# Patient Record
Sex: Male | Born: 1970 | Race: White | Hispanic: No | Marital: Single | Smoking: Never smoker
Health system: Southern US, Community
[De-identification: ages and names within clinical notes are randomized; demographics above are authoritative.]

## PROBLEM LIST (undated history)

## (undated) DIAGNOSIS — G809 Cerebral palsy, unspecified: Secondary | ICD-10-CM

## (undated) DIAGNOSIS — I10 Essential (primary) hypertension: Secondary | ICD-10-CM

## (undated) DIAGNOSIS — E7800 Pure hypercholesterolemia, unspecified: Secondary | ICD-10-CM

## (undated) DIAGNOSIS — R519 Headache, unspecified: Secondary | ICD-10-CM

## (undated) HISTORY — PX: OTHER SURGICAL HISTORY: SHX169

## (undated) HISTORY — PX: LEG SURGERY: SHX1003

---

## 1997-10-09 ENCOUNTER — Other Ambulatory Visit: Admission: RE | Admit: 1997-10-09 | Discharge: 1997-10-09 | Payer: Self-pay | Admitting: Family Medicine

## 1999-03-06 ENCOUNTER — Emergency Department (HOSPITAL_COMMUNITY): Admission: EM | Admit: 1999-03-06 | Discharge: 1999-03-06 | Payer: Self-pay

## 2004-04-30 ENCOUNTER — Emergency Department (HOSPITAL_COMMUNITY): Admission: EM | Admit: 2004-04-30 | Discharge: 2004-04-30 | Payer: Self-pay | Admitting: Emergency Medicine

## 2004-12-23 ENCOUNTER — Emergency Department (HOSPITAL_COMMUNITY): Admission: EM | Admit: 2004-12-23 | Discharge: 2004-12-23 | Payer: Self-pay | Admitting: Emergency Medicine

## 2007-04-27 ENCOUNTER — Emergency Department (HOSPITAL_COMMUNITY): Admission: EM | Admit: 2007-04-27 | Discharge: 2007-04-27 | Payer: Self-pay | Admitting: Emergency Medicine

## 2012-08-06 ENCOUNTER — Other Ambulatory Visit: Payer: Self-pay | Admitting: Family Medicine

## 2012-08-06 ENCOUNTER — Ambulatory Visit
Admission: RE | Admit: 2012-08-06 | Discharge: 2012-08-06 | Disposition: A | Discharge status: Home / Self Care | Payer: Medicaid Other | Source: Ambulatory Visit | Attending: Family Medicine | Admitting: Family Medicine

## 2012-08-06 DIAGNOSIS — R1030 Lower abdominal pain, unspecified: Secondary | ICD-10-CM

## 2014-05-09 DIAGNOSIS — K029 Dental caries, unspecified: Secondary | ICD-10-CM | POA: Insufficient documentation

## 2015-09-07 ENCOUNTER — Emergency Department (INDEPENDENT_AMBULATORY_CARE_PROVIDER_SITE_OTHER)
Admission: EM | Admit: 2015-09-07 | Discharge: 2015-09-07 | Disposition: A | Discharge status: Home / Self Care | Payer: Medicare Other | Source: Home / Self Care | Attending: Family Medicine | Admitting: Family Medicine

## 2015-09-07 ENCOUNTER — Ambulatory Visit: Payer: Medicaid Other

## 2015-09-07 ENCOUNTER — Encounter (HOSPITAL_COMMUNITY): Payer: Self-pay | Admitting: *Deleted

## 2015-09-07 DIAGNOSIS — L259 Unspecified contact dermatitis, unspecified cause: Secondary | ICD-10-CM | POA: Diagnosis not present

## 2015-09-07 HISTORY — DX: Essential (primary) hypertension: I10

## 2015-09-07 MED ORDER — FLUTICASONE PROPIONATE 0.05 % EX CREA: TOPICAL_CREAM | Freq: Two times a day (BID) | CUTANEOUS | Status: DC

## 2015-09-07 NOTE — ED Notes (Signed)
Pt  Has  A rash  On  r  Arm  Noticed  No  Known  Causative  Agent        Noticed  Today  Vesicular    In  Appearance       Itches

## 2015-09-07 NOTE — ED Provider Notes (Signed)
CSN: [id number]     Arrival date & time 09/07/15  1625 History   First MD Initiated Contact with Patient 09/07/15 1907     Chief Complaint  Patient presents with  . Rash   (Consider location/radiation/quality/duration/timing/severity/associated sxs/prior Treatment) Patient is a 45 y.o. male presenting with rash. The history is provided by the patient.  Rash Location:  Shoulder/arm Shoulder/arm rash location:  L forearm and R forearm Quality: blistering, itchiness, peeling, redness, scaling and swelling   Severity:  Mild Onset quality:  Sudden Duration:  1 day Progression:  Spreading Chronicity:  New   Past Medical History  Diagnosis Date  . Hypertension    Past Surgical History  Procedure Laterality Date  . Leg surgery     History reviewed. No pertinent family history. Social History  Substance Use Topics  . Smoking status: Never Smoker   . Smokeless tobacco: None  . Alcohol Use: No    Review of Systems  Constitutional: Negative.   Musculoskeletal: Negative.   Skin: Positive for rash.  All other systems reviewed and are negative.   Allergies  Review of patient's allergies indicates not on file.  Home Medications   Prior to Admission medications   Medication Sig Start Date End Date Taking? Authorizing Provider  atenolol (TENORMIN) 100 MG tablet Take 100 mg by mouth daily.   Yes Historical Provider, MD  Diphenoxylate-Atropine (LOMOTIL PO) Take by mouth.   Yes Historical Provider, MD  lisinopril-hydrochlorothiazide (PRINZIDE,ZESTORETIC) 20-25 MG tablet Take 1 tablet by mouth daily.   Yes Historical Provider, MD  simvastatin (ZOCOR) 20 MG tablet Take 20 mg by mouth daily.   Yes Historical Provider, MD  fluticasone (CUTIVATE) 0.05 % cream Apply topically 2 (two) times daily. 09/07/15   Linna HoffJames D Kavion Mancinas, MD   Meds Ordered and Administered this Visit  Medications - No data to display  BP 133/89 mmHg  Pulse 68  Temp(Src) 98.8 F (37.1 C) (Oral)  Resp 18  SpO2 97% No  data found.   Physical Exam  Constitutional: He is oriented to person, place, and time.  Neurological: He is alert and oriented to person, place, and time.  Skin: Skin is warm and dry. Rash noted.  Patchy excoriated crusting sl erythematous rashes to extensor forearms bilat., no purulent drainage.  Nursing note and vitals reviewed.   ED Course  Procedures (including critical care time)  Labs Review Labs Reviewed - No data to display  Imaging Review No results found.   Visual Acuity Review  Right Eye Distance:   Left Eye Distance:   Bilateral Distance:    Right Eye Near:   Left Eye Near:    Bilateral Near:         MDM   1. Contact dermatitis and eczema due to cause    Meds ordered this encounter  Medications  . simvastatin (ZOCOR) 20 MG tablet    Sig: Take 20 mg by mouth daily.  Marland Kitchen. atenolol (TENORMIN) 100 MG tablet    Sig: Take 100 mg by mouth daily.  Marland Kitchen. lisinopril-hydrochlorothiazide (PRINZIDE,ZESTORETIC) 20-25 MG tablet    Sig: Take 1 tablet by mouth daily.  . Diphenoxylate-Atropine (LOMOTIL PO)    Sig: Take by mouth.  . fluticasone (CUTIVATE) 0.05 % cream    Sig: Apply topically 2 (two) times daily.    Dispense:  30 g    Refill:  1       Linna HoffJames D Mahathi Pokorney, MD 09/07/15 1946

## 2017-02-14 ENCOUNTER — Ambulatory Visit (HOSPITAL_COMMUNITY)
Admission: EM | Admit: 2017-02-14 | Discharge: 2017-02-14 | Disposition: A | Discharge status: Home / Self Care | Payer: Medicare Other | Attending: Family Medicine | Admitting: Family Medicine

## 2017-02-14 ENCOUNTER — Encounter (HOSPITAL_COMMUNITY): Payer: Self-pay | Admitting: Emergency Medicine

## 2017-02-14 DIAGNOSIS — W268XXA Contact with other sharp object(s), not elsewhere classified, initial encounter: Secondary | ICD-10-CM | POA: Diagnosis not present

## 2017-02-14 DIAGNOSIS — S61011A Laceration without foreign body of right thumb without damage to nail, initial encounter: Secondary | ICD-10-CM | POA: Diagnosis not present

## 2017-02-14 DIAGNOSIS — Z23 Encounter for immunization: Secondary | ICD-10-CM

## 2017-02-14 MED ORDER — TETANUS-DIPHTH-ACELL PERTUSSIS 5-2.5-18.5 LF-MCG/0.5 IM SUSP
0.5000 mL | Freq: Once | INTRAMUSCULAR | Status: AC
  Administered 2017-02-14: 0.5 mL via INTRAMUSCULAR

## 2017-02-14 MED ORDER — TETANUS-DIPHTH-ACELL PERTUSSIS 5-2.5-18.5 LF-MCG/0.5 IM SUSP
INTRAMUSCULAR | Status: AC
  Filled 2017-02-14: qty 0.5

## 2017-02-14 NOTE — ED Triage Notes (Signed)
PT has laceration over right thumb. PT cut it on a screwdriver this AM at 10AM. Last tetanus shot unknown.

## 2017-02-14 NOTE — Discharge Instructions (Signed)
Keep the sutures dry for 48 hours.  Follow up for suture removal in 10 days.

## 2017-02-14 NOTE — ED Provider Notes (Addendum)
[facility]    CSN: [id number] Arrival date & time: 02/14/17  1840     History   Chief Complaint Chief Complaint  Patient presents with  . Laceration    HPI Richard Carpenter is a 46 y.o. male.   Patient lacerated right thumb on dorsum with a screw driver.  He is unsure about tetanus.    Laceration  Location:  Finger Finger laceration location:  R thumb Length:  3 cm Depth:  Cutaneous Quality: straight   Bleeding: venous   Time since incident:  2 hours Laceration mechanism:  Blunt object Pain details:    Quality:  Aching   Severity:  Moderate   Timing:  Constant Foreign body present:  No foreign bodies Relieved by:  Nothing Worsened by:  Nothing Ineffective treatments:  None tried Tetanus status:  Unknown   Past Medical History:  Diagnosis Date  . Hypertension     There are no active problems to display for this patient.   Past Surgical History:  Procedure Laterality Date  . LEG SURGERY         Home Medications    Prior to Admission medications   Medication Sig Start Date End Date Taking? Authorizing Provider  atenolol (TENORMIN) 100 MG tablet Take 100 mg by mouth daily.    [provider]  Diphenoxylate-Atropine (LOMOTIL PO) Take by mouth.    [provider]  fluticasone (CUTIVATE) 0.05 % cream Apply topically 2 (two) times daily. 09/07/15   Linna HoffKindl, James D, MD  lisinopril-hydrochlorothiazide (PRINZIDE,ZESTORETIC) 20-25 MG tablet Take 1 tablet by mouth daily.    [provider]  simvastatin (ZOCOR) 20 MG tablet Take 20 mg by mouth daily.    [provider]    Family History No family history on file.  Social History Social History  Substance Use Topics  . Smoking status: Never Smoker  . Smokeless tobacco: Not on file  . Alcohol use No     Allergies   Patient has no known allergies.   Review of Systems Review of Systems  Constitutional: Negative.   HENT: Negative.   Eyes: Negative.     Respiratory: Negative.   Cardiovascular: Negative.   Gastrointestinal: Negative.   Endocrine: Negative.   Genitourinary: Negative.   Musculoskeletal: Negative.   Skin: Positive for wound.  Allergic/Immunologic: Negative.   Neurological: Negative.   Hematological: Negative.   Psychiatric/Behavioral: Negative.      Physical Exam Triage Vital Signs ED Triage Vitals [02/14/17 1936]  Enc Vitals Group     BP (!) 148/93     Pulse Rate (!) 53     Resp 16     Temp 99 F (37.2 C)     Temp Source Oral     SpO2 97 %     Weight      Height      Head Circumference      Peak Flow      Pain Score      Pain Loc      Pain Edu?      Excl. in GC?    No data found.   Updated Vital Signs BP (!) 148/93   Pulse (!) 53   Temp 99 F (37.2 C) (Oral)   Resp 16   SpO2 97%   Visual Acuity Right Eye Distance:   Left Eye Distance:   Bilateral Distance:    Right Eye Near:   Left Eye Near:    Bilateral Near:  Physical Exam  Constitutional: He is oriented to person, place, and time. He appears well-developed and well-nourished.  HENT:  Head: Normocephalic and atraumatic.  Eyes: Pupils are equal, round, and reactive to light. Conjunctivae and EOM are normal.  Neck: Normal range of motion. Neck supple.  Cardiovascular: Normal rate, regular rhythm and normal heart sounds.   Pulmonary/Chest: Effort normal and breath sounds normal.  Abdominal: Soft. Bowel sounds are normal.  Musculoskeletal: Normal range of motion.  Neurological: He is alert and oriented to person, place, and time.  Skin:  3 cm laceration right thumb  Nursing note and vitals reviewed.    UC Treatments / Results  Labs (all labs ordered are listed, but only abnormal results are displayed) Labs Reviewed - No data to display  EKG  EKG Interpretation None       Radiology No results found.  Procedures .Marland KitchenLaceration Repair Date/Time: 02/14/2017 8:04 PM Performed by: Deatra Canter Authorized by:  Deatra Canter   Consent:    Consent obtained:  Verbal   Consent given by:  Patient   Risks discussed:  Infection and pain   Alternatives discussed:  No treatment Anesthesia (see MAR for exact dosages):    Anesthesia method:  Local infiltration   Local anesthetic:  Lidocaine 1% WITH epi Laceration details:    Location:  Finger   Finger location:  R thumb   Length (cm):  3   Depth (mm):  2 Repair type:    Repair type:  Simple Pre-procedure details:    Preparation:  Patient was prepped and draped in usual sterile fashion Exploration:    Hemostasis achieved with:  Direct pressure   Wound exploration: wound explored through full range of motion     Contaminated: no   Treatment:    Area cleansed with:  Betadine   Amount of cleaning:  Standard   Irrigation solution:  Sterile saline   Irrigation volume:  100 ml   Irrigation method:  Syringe   Visualized foreign bodies/material removed: no   Skin repair:    Repair method:  Sutures   Suture material:  Nylon   Suture technique:  Simple interrupted   Number of sutures:  4 Approximation:    Approximation:  Close Post-procedure details:    Dressing:  Antibiotic ointment and non-adherent dressing   Patient tolerance of procedure:  Tolerated well, no immediate complications   (including critical care time)  Medications Ordered in UC Medications  Tdap (BOOSTRIX) injection 0.5 mL (0.5 mLs Intramuscular Given 02/14/17 1958)     Initial Impression / Assessment and Plan / UC Course  I have reviewed the triage vital signs and the nursing notes.  Pertinent labs & imaging results that were available during my care of the patient were reviewed by me and considered in my medical decision making (see chart for details).       Final Clinical Impressions(s) / UC Diagnoses   Final diagnoses:  Laceration of right thumb without foreign body without damage to nail, initial encounter    New Prescriptions New Prescriptions   No  medications on file     Controlled Substance Prescriptions [place] Controlled Substance Registry consulted? Not Applicable   Deatra Canter, FNP 02/14/17 2006    Deatra Canter, FNP 02/14/17 2011

## 2018-07-19 ENCOUNTER — Other Ambulatory Visit: Payer: Self-pay | Admitting: Podiatry

## 2018-07-19 ENCOUNTER — Ambulatory Visit (INDEPENDENT_AMBULATORY_CARE_PROVIDER_SITE_OTHER): Payer: Medicare Other

## 2018-07-19 ENCOUNTER — Encounter: Payer: Self-pay | Admitting: Podiatry

## 2018-07-19 ENCOUNTER — Ambulatory Visit (INDEPENDENT_AMBULATORY_CARE_PROVIDER_SITE_OTHER): Payer: Medicare Other | Admitting: Podiatry

## 2018-07-19 VITALS — BP 139/86 | HR 47 | Resp 16

## 2018-07-19 DIAGNOSIS — M722 Plantar fascial fibromatosis: Secondary | ICD-10-CM

## 2018-07-19 DIAGNOSIS — M79672 Pain in left foot: Secondary | ICD-10-CM

## 2018-07-19 MED ORDER — TRIAMCINOLONE ACETONIDE 10 MG/ML IJ SUSP
10.0000 mg | Freq: Once | INTRAMUSCULAR | Status: AC
  Administered 2018-07-19: 10 mg

## 2018-07-19 NOTE — Progress Notes (Signed)
   Subjective:    Patient ID: Richard Carpenter, male    DOB: 10-Dec-1970, 48 y.o.   MRN: [id number]  HPI    Review of Systems  All other systems reviewed and are negative.      Objective:   Physical Exam        Assessment & Plan:

## 2018-07-25 NOTE — Progress Notes (Signed)
Subjective:   Patient ID: Richard Carpenter, male   DOB: 48 y.o.   MRN: [id number]   HPI Patient presents stating I have a small knot my left arch and its increasingly at times discomforting I want to get it checked and see what is causing it.  Patient does not give smoking history likes to be active as best as possible   Review of Systems  All other systems reviewed and are negative.       Objective:  Physical Exam Vitals signs and nursing note reviewed.  Constitutional:      Appearance: He is well-developed.  Pulmonary:     Effort: Pulmonary effort is normal.  Musculoskeletal: Normal range of motion.  Skin:    General: Skin is warm.  Neurological:     Mental Status: He is alert.     Neurovascular status was found to be intact muscle strength was adequate patient found to have nodules plantar aspect left arch localized in nature that are very small measuring about 5 x 5 mm and tender with palpation.  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  We will get a try to shrink them and get amount of pain and I did do a sterile prep and injected the mid arch 3 mg Dexasone Kenalog 5 mg Xylocaine and advised on heat therapy at home and reappoint as needed  X-ray indicates no signs of calcification or spur formation

## 2020-04-06 ENCOUNTER — Encounter (HOSPITAL_COMMUNITY): Payer: Self-pay | Admitting: Emergency Medicine

## 2020-04-06 ENCOUNTER — Emergency Department (HOSPITAL_COMMUNITY)
Admission: EM | Admit: 2020-04-06 | Discharge: 2020-04-06 | Disposition: A | Discharge status: Home / Self Care | Payer: Medicare Other | Attending: Emergency Medicine | Admitting: Emergency Medicine

## 2020-04-06 ENCOUNTER — Emergency Department (HOSPITAL_COMMUNITY): Payer: Medicare Other

## 2020-04-06 ENCOUNTER — Other Ambulatory Visit: Payer: Self-pay

## 2020-04-06 DIAGNOSIS — R52 Pain, unspecified: Secondary | ICD-10-CM

## 2020-04-06 DIAGNOSIS — Z79899 Other long term (current) drug therapy: Secondary | ICD-10-CM | POA: Insufficient documentation

## 2020-04-06 DIAGNOSIS — W19XXXA Unspecified fall, initial encounter: Secondary | ICD-10-CM | POA: Diagnosis not present

## 2020-04-06 DIAGNOSIS — M25551 Pain in right hip: Secondary | ICD-10-CM | POA: Insufficient documentation

## 2020-04-06 DIAGNOSIS — I10 Essential (primary) hypertension: Secondary | ICD-10-CM | POA: Diagnosis not present

## 2020-04-06 DIAGNOSIS — M25552 Pain in left hip: Secondary | ICD-10-CM | POA: Insufficient documentation

## 2020-04-06 MED ORDER — KETOROLAC TROMETHAMINE 30 MG/ML IJ SOLN
15.0000 mg | Freq: Once | INTRAMUSCULAR | Status: AC
  Administered 2020-04-06: 15 mg via INTRAMUSCULAR
  Filled 2020-04-06: qty 1

## 2020-04-06 NOTE — ED Provider Notes (Signed)
[facility]-EMERGENCY DEPT Provider Note   CSN: [id number] Arrival date & time: 04/06/20  1207     History Chief Complaint  Patient presents with  . Fall    Bilateral Hip pain     AADON GORELIK is a 49 y.o. male.  HPI     Patient presents with bilateral hip pain.  Line pain is in the posterior superior iliac region bilaterally. He had a fall a week ago, but seemingly complained of only head pain after that fall. Today, he had worsening pain, while using the bathroom.  He has been using Tylenol, it is not clear if this has made a difference. He denies other abdominal pain, dysuria or other complaints. No additional falls, no additional trauma.  Past Medical History:  Diagnosis Date  . Hypertension     Patient Active Problem List   Diagnosis Date Noted  . Dental caries 05/09/2014    Past Surgical History:  Procedure Laterality Date  . LEG SURGERY         History reviewed. No pertinent family history.  Social History   Tobacco Use  . Smoking status: Never Smoker  . Smokeless tobacco: Never Used  Substance Use Topics  . Alcohol use: No  . Drug use: Not on file    Home Medications Prior to Admission medications   Medication Sig Start Date End Date Taking? Authorizing Provider  atenolol (TENORMIN) 100 MG tablet Take 100 mg by mouth daily.    [provider]  atorvastatin (LIPITOR) 40 MG tablet  07/05/18   [provider]  Diphenoxylate-Atropine (LOMOTIL PO) Take by mouth.    [provider]  fluticasone (CUTIVATE) 0.05 % cream Apply topically 2 (two) times daily. 09/07/15   Linna Hoff, MD  gemfibrozil (LOPID) 600 MG tablet  07/05/18   [provider]  lisinopril-hydrochlorothiazide (PRINZIDE,ZESTORETIC) 20-25 MG tablet Take 1 tablet by mouth daily.    [provider]    Allergies    Patient has no known allergies.  Review of Systems   Review of Systems  Constitutional:       Per HPI,  otherwise negative  HENT:       Per HPI, otherwise negative  Respiratory:       Per HPI, otherwise negative  Cardiovascular:       Per HPI, otherwise negative  Gastrointestinal: Negative for vomiting.  Endocrine:       Negative aside from HPI  Genitourinary:       Neg aside from HPI   Musculoskeletal:       Per HPI, otherwise negative  Skin: Negative.   Neurological: Negative for syncope.    Physical Exam Updated Vital Signs BP (!) 142/83 (BP Location: Right Arm)   Pulse 72   Temp 98.2 F (36.8 C) (Oral)   Resp 16   SpO2 97%   Physical Exam Vitals and nursing note reviewed.  Constitutional:      General: He is not in acute distress.    Appearance: He is well-developed.  HENT:     Head: Normocephalic and atraumatic.  Eyes:     Conjunctiva/sclera: Conjunctivae normal.  Cardiovascular:     Rate and Rhythm: Normal rate and regular rhythm.  Pulmonary:     Effort: Pulmonary effort is normal. No respiratory distress.     Breath sounds: No stridor.  Abdominal:     General: There is no distension.  Musculoskeletal:     Comments: Patient has no gross deformities, can  flex each hip independently, and knees bilaterally.  He has more pain referred to the right lower back with right hip flexion on left.  Skin:    General: Skin is warm and dry.  Neurological:     Mental Status: He is alert and oriented to person, place, and time.  Psychiatric:     Comments: Cognitive impairment     ED Results / Procedures / Treatments   Labs (all labs ordered are listed, but only abnormal results are displayed) Labs Reviewed - No data to display  EKG None  Radiology DG Hip Unilat W or Wo Pelvis 2-3 Views Left  Result Date: 04/06/2020 CLINICAL DATA:  Bilateral hip pain after fall. EXAM: DG HIP (WITH OR WITHOUT PELVIS) 2-3V LEFT COMPARISON:  None. FINDINGS: There is no evidence of hip fracture or dislocation. There is no evidence of arthropathy or other focal bone abnormality.  IMPRESSION: Negative. Electronically Signed   By: Lupita Raider M.D.   On: 04/06/2020 14:07   DG HIP UNILAT WITH PELVIS 2-3 VIEWS RIGHT  Result Date: 04/06/2020 CLINICAL DATA:  Bilateral hip pain after fall. EXAM: DG HIP (WITH OR WITHOUT PELVIS) 2-3V RIGHT COMPARISON:  None. FINDINGS: There is no evidence of hip fracture or dislocation. There is no evidence of arthropathy or other focal bone abnormality. IMPRESSION: Negative. Electronically Signed   By: Lupita Raider M.D.   On: 04/06/2020 14:08    Procedures Procedures (including critical care time)  Medications Ordered in ED Medications  ketorolac (TORADOL) 30 MG/ML injection 15 mg (15 mg Intramuscular Given 04/06/20 1235)    ED Course  I have reviewed the triage vital signs and the nursing notes.  Pertinent labs & imaging results that were available during my care of the patient were reviewed by me and considered in my medical decision making (see chart for details).   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  Generally well-appearing adult male, with some cognitive impairment presents with bilateral hip pain. No evidence for distal neurovascular compromise, no abdominal pain, no suggestion of CNS pathology, and no urinary complaints reassuring for absence of suspicion of nephrolithiasis or GU pathology. Patient's x-ray is reassuring, some suspicion for musculoskeletal etiology.  Line patient discharged in stable condition with ongoing anti-inflammatories. Final Clinical Impression(s) / ED Diagnoses Final diagnoses:  Pain     Gerhard Munch, MD 04/06/20 1433

## 2020-04-06 NOTE — ED Triage Notes (Signed)
BIBA Per [facility]: Pt coming from group home  Pt had an unwitnessed fall this morning  Pt complaining of bilateral hip pain  Vitals 148/88 76 HR  18 RR 96% room air

## 2020-04-06 NOTE — Discharge Instructions (Addendum)
As discussed, today's evaluation has been reassuring. There are some suspicion for pain coming from a musculoskeletal causes, such as a muscle strain or irritation of the nerves as they leave your back. Please use ibuprofen, 400 mg, 3 times daily, taken with food for the next 3 days.  Follow-up with your physician as needed, or return here if you develop new, or concerning changes in your condition.

## 2021-06-25 IMAGING — CR DG HIP (WITH OR WITHOUT PELVIS) 2-3V*R*
3 series · 3 of 3 positions shown · non-contrast
Comparison: None.

CLINICAL DATA: Bilateral hip pain after fall.

EXAM:
DG HIP (WITH OR WITHOUT PELVIS) 2-3V RIGHT

[t pelvis ap]
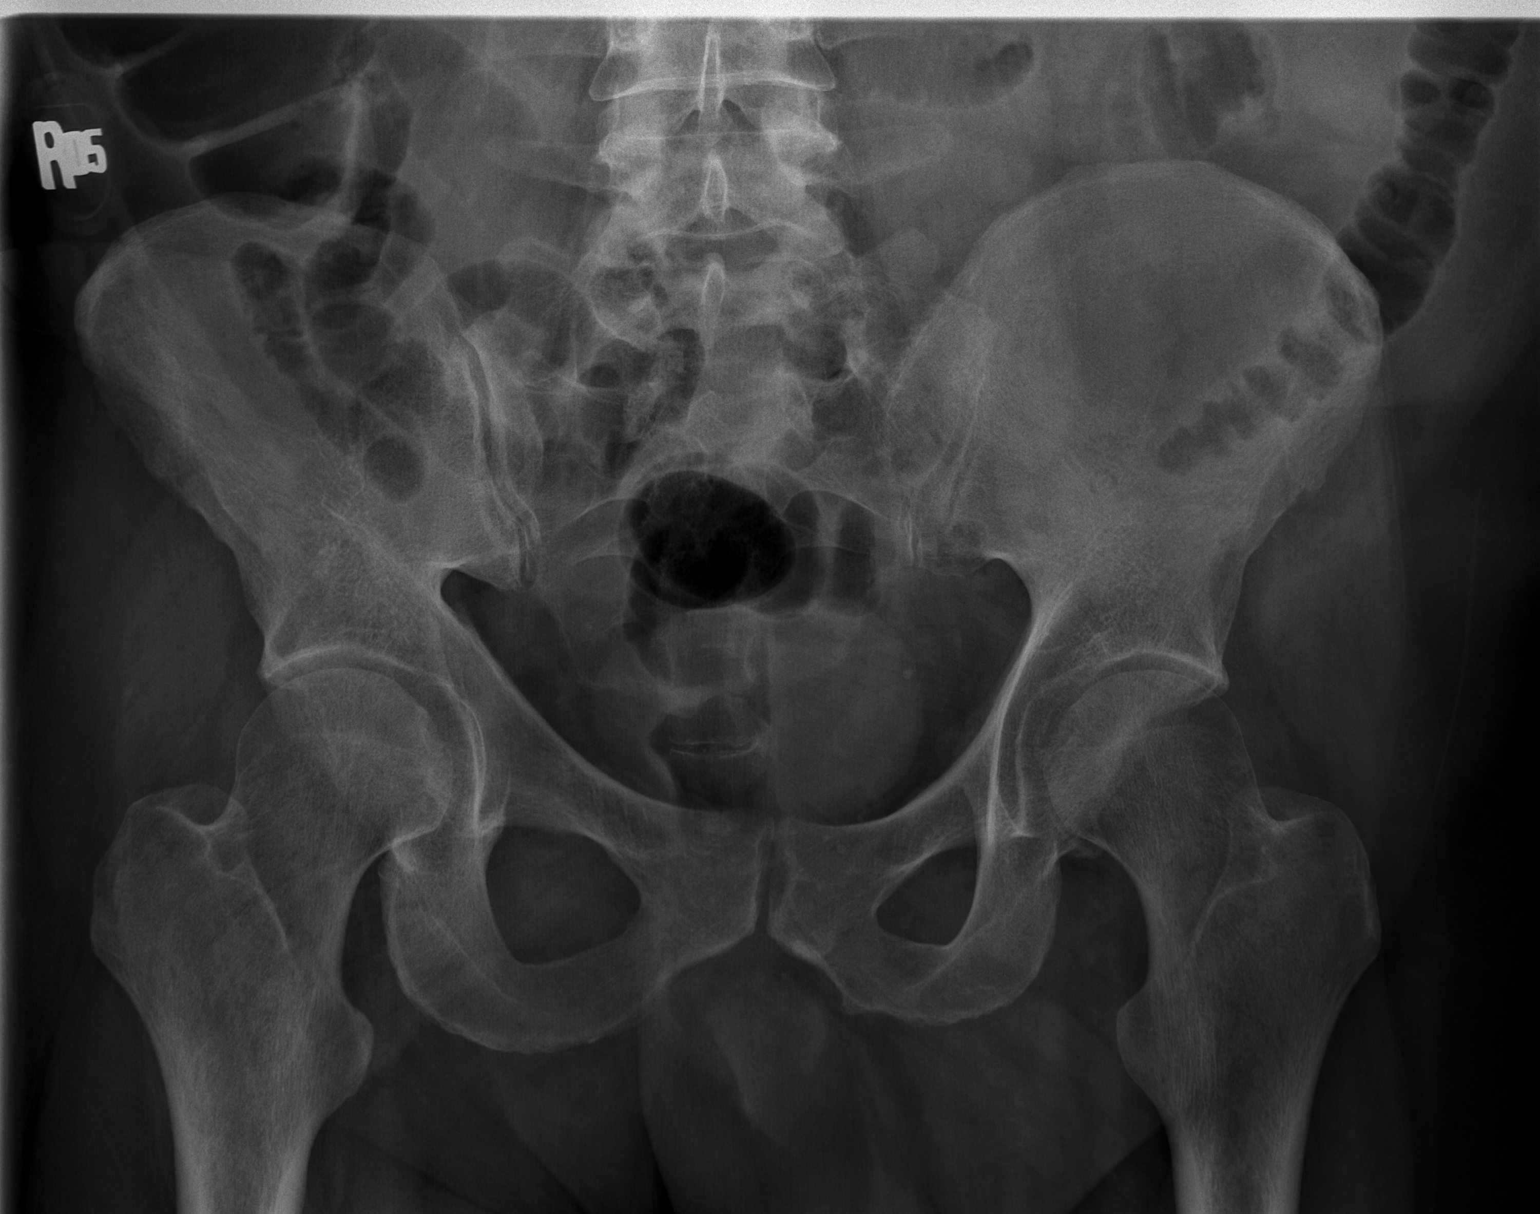

[t hip ap right]
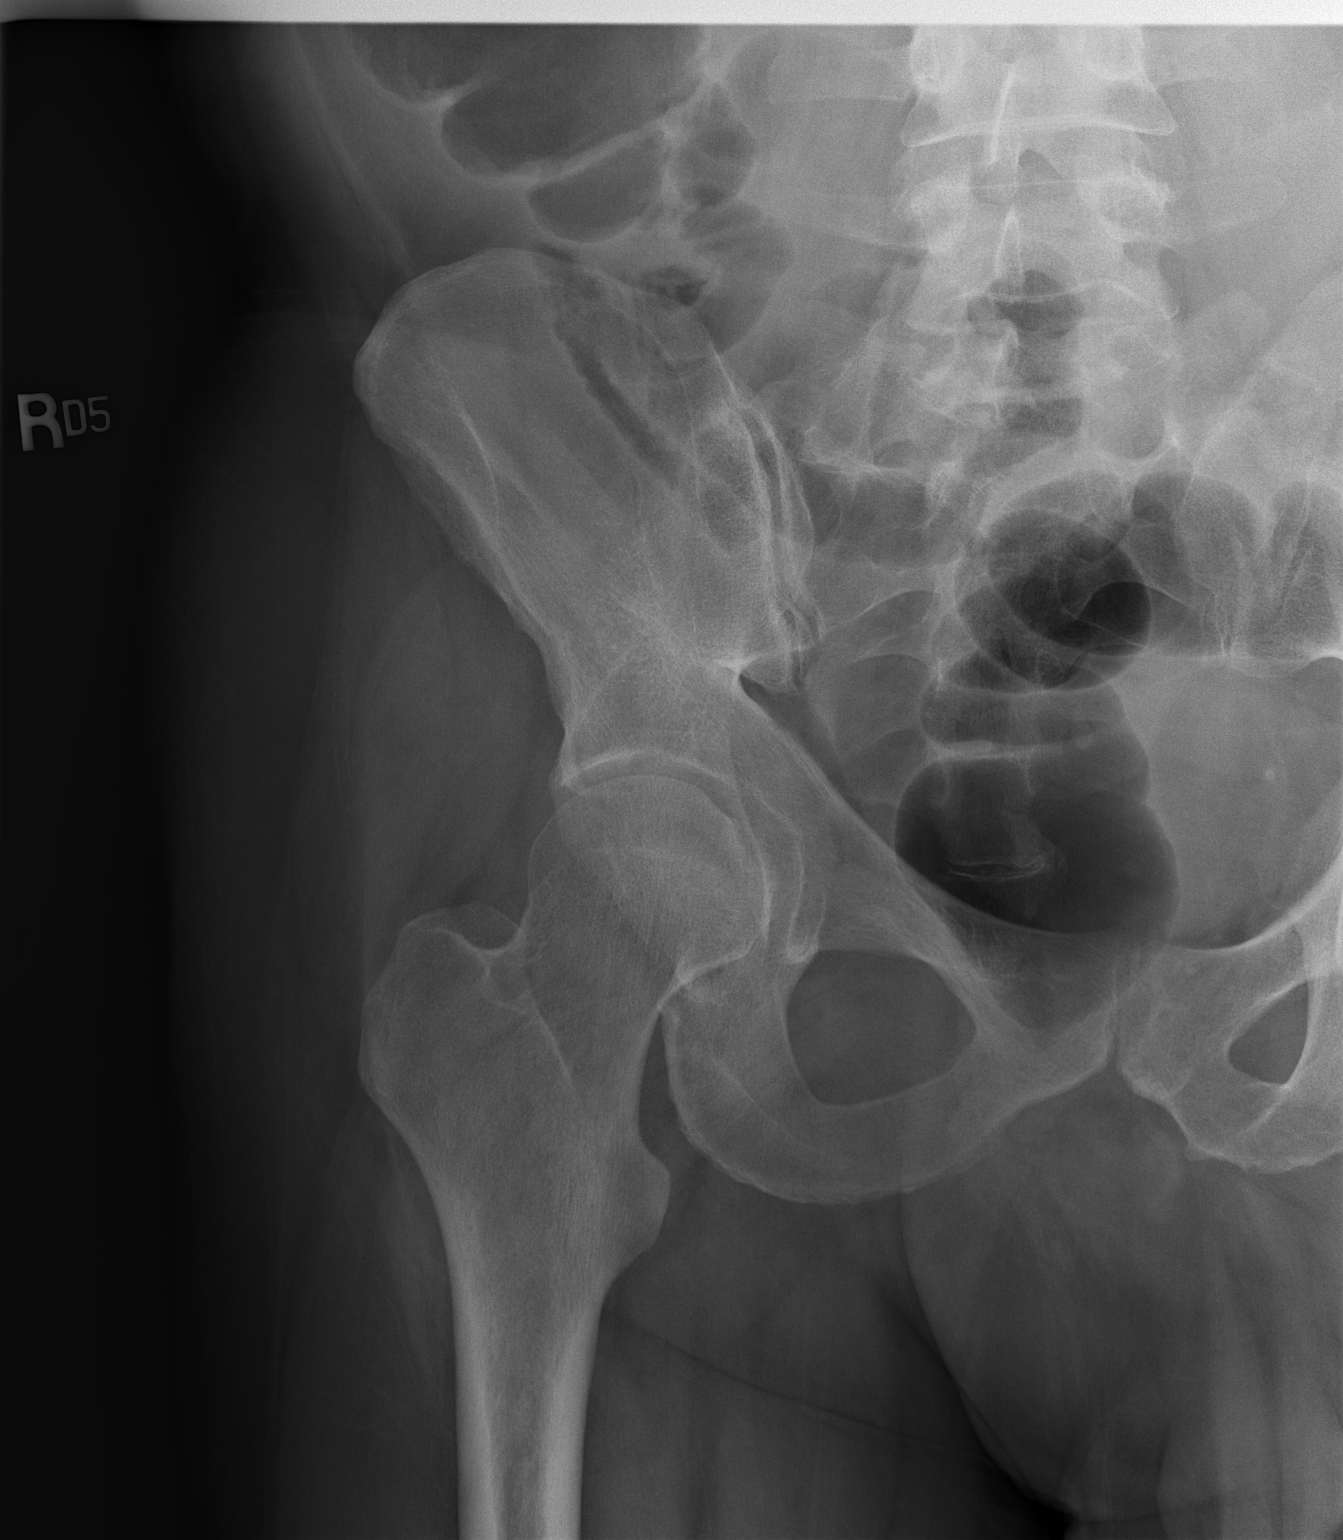

[t hip frog leg right]
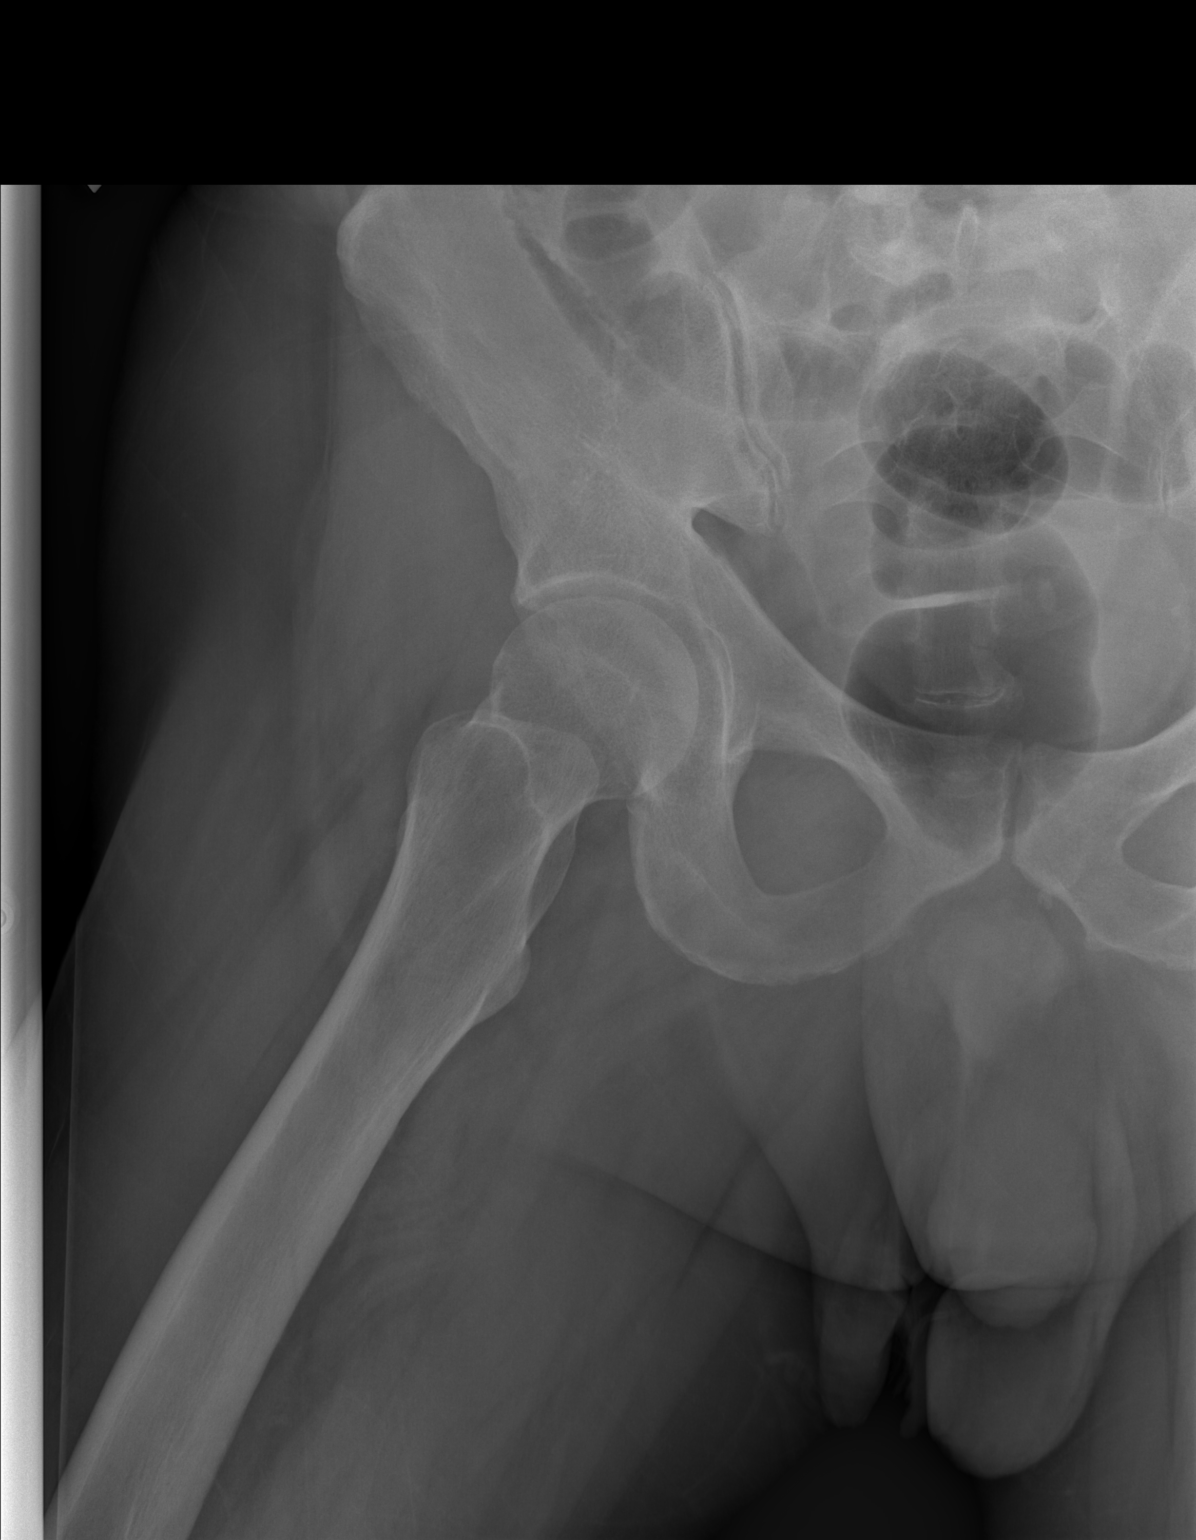

[3 of 3 positions shown; findings below may reference images not displayed]

FINDINGS: There is no evidence of hip fracture or dislocation. There is no
evidence of arthropathy or other focal bone abnormality.
IMPRESSION: Negative.

## 2024-02-13 ENCOUNTER — Other Ambulatory Visit: Payer: Self-pay

## 2024-02-13 ENCOUNTER — Emergency Department (HOSPITAL_COMMUNITY)

## 2024-02-13 ENCOUNTER — Encounter (HOSPITAL_COMMUNITY): Payer: Self-pay

## 2024-02-13 ENCOUNTER — Encounter (HOSPITAL_COMMUNITY): Payer: Self-pay | Admitting: Emergency Medicine

## 2024-02-13 ENCOUNTER — Inpatient Hospital Stay (HOSPITAL_COMMUNITY)
Admission: EM | Admit: 2024-02-13 | Discharge: 2024-02-19 | DRG: 439 | Disposition: A | Discharge status: Home / Self Care | Attending: Internal Medicine | Admitting: Internal Medicine

## 2024-02-13 ENCOUNTER — Ambulatory Visit (HOSPITAL_COMMUNITY): Admission: EM | Admit: 2024-02-13 | Discharge: 2024-02-13 | Disposition: A | Discharge status: Home / Self Care

## 2024-02-13 DIAGNOSIS — R102 Pelvic and perineal pain: Secondary | ICD-10-CM

## 2024-02-13 DIAGNOSIS — R000 Tachycardia, unspecified: Secondary | ICD-10-CM | POA: Diagnosis not present

## 2024-02-13 DIAGNOSIS — R6250 Unspecified lack of expected normal physiological development in childhood: Secondary | ICD-10-CM | POA: Diagnosis present

## 2024-02-13 DIAGNOSIS — I10 Essential (primary) hypertension: Secondary | ICD-10-CM | POA: Diagnosis present

## 2024-02-13 DIAGNOSIS — R809 Proteinuria, unspecified: Secondary | ICD-10-CM | POA: Diagnosis present

## 2024-02-13 DIAGNOSIS — K851 Biliary acute pancreatitis without necrosis or infection: Secondary | ICD-10-CM | POA: Diagnosis not present

## 2024-02-13 DIAGNOSIS — R1013 Epigastric pain: Secondary | ICD-10-CM | POA: Diagnosis not present

## 2024-02-13 DIAGNOSIS — R110 Nausea: Secondary | ICD-10-CM | POA: Diagnosis not present

## 2024-02-13 DIAGNOSIS — R8271 Bacteriuria: Secondary | ICD-10-CM | POA: Diagnosis present

## 2024-02-13 DIAGNOSIS — K8020 Calculus of gallbladder without cholecystitis without obstruction: Secondary | ICD-10-CM | POA: Diagnosis present

## 2024-02-13 DIAGNOSIS — I959 Hypotension, unspecified: Secondary | ICD-10-CM

## 2024-02-13 DIAGNOSIS — R7989 Other specified abnormal findings of blood chemistry: Secondary | ICD-10-CM | POA: Diagnosis present

## 2024-02-13 DIAGNOSIS — N179 Acute kidney failure, unspecified: Secondary | ICD-10-CM | POA: Diagnosis present

## 2024-02-13 DIAGNOSIS — Z6831 Body mass index (BMI) 31.0-31.9, adult: Secondary | ICD-10-CM

## 2024-02-13 DIAGNOSIS — E7800 Pure hypercholesterolemia, unspecified: Secondary | ICD-10-CM | POA: Diagnosis present

## 2024-02-13 DIAGNOSIS — R630 Anorexia: Secondary | ICD-10-CM | POA: Diagnosis present

## 2024-02-13 DIAGNOSIS — R609 Edema, unspecified: Secondary | ICD-10-CM | POA: Diagnosis present

## 2024-02-13 DIAGNOSIS — R7401 Elevation of levels of liver transaminase levels: Secondary | ICD-10-CM | POA: Diagnosis present

## 2024-02-13 DIAGNOSIS — R197 Diarrhea, unspecified: Secondary | ICD-10-CM | POA: Diagnosis present

## 2024-02-13 DIAGNOSIS — K859 Acute pancreatitis without necrosis or infection, unspecified: Principal | ICD-10-CM | POA: Diagnosis present

## 2024-02-13 DIAGNOSIS — Z79899 Other long term (current) drug therapy: Secondary | ICD-10-CM

## 2024-02-13 HISTORY — DX: Pure hypercholesterolemia, unspecified: E78.00

## 2024-02-13 LAB — COMPREHENSIVE METABOLIC PANEL WITH GFR
ALT: 20 U/L (ref 0–44)
AST: 33 U/L (ref 15–41)
Albumin: 3.4 g/dL — ABNORMAL LOW (ref 3.5–5.0)
Alkaline Phosphatase: 136 U/L — ABNORMAL HIGH (ref 38–126)
Anion gap: 17 — ABNORMAL HIGH (ref 5–15)
BUN: 46 mg/dL — ABNORMAL HIGH (ref 6–20)
CO2: 19 mmol/L — ABNORMAL LOW (ref 22–32)
Calcium: 8.9 mg/dL (ref 8.9–10.3)
Chloride: 101 mmol/L (ref 98–111)
Creatinine, Ser: 2.49 mg/dL — ABNORMAL HIGH (ref 0.61–1.24)
GFR, Estimated: 30 mL/min — ABNORMAL LOW (ref >60)
Glucose, Bld: 124 mg/dL — ABNORMAL HIGH (ref 70–99)
Potassium: 4 mmol/L (ref 3.5–5.1)
Sodium: 137 mmol/L (ref 135–145)
Total Bilirubin: 1.3 mg/dL — ABNORMAL HIGH (ref 0.0–1.2)
Total Protein: 8.3 g/dL — ABNORMAL HIGH (ref 6.5–8.1)

## 2024-02-13 LAB — CBC
HCT: 39.2 % (ref 39.0–52.0)
Hemoglobin: 13.6 g/dL (ref 13.0–17.0)
MCH: 30.3 pg (ref 26.0–34.0)
MCHC: 34.7 g/dL (ref 30.0–36.0)
MCV: 87.3 fL (ref 80.0–100.0)
Platelets: 387 K/uL (ref 150–400)
RBC: 4.49 MIL/uL (ref 4.22–5.81)
RDW: 13.6 % (ref 11.5–15.5)
WBC: 35.3 K/uL — ABNORMAL HIGH (ref 4.0–10.5)
nRBC: 0 % (ref 0.0–0.2)

## 2024-02-13 LAB — LIPASE, BLOOD: Lipase: 120 U/L — ABNORMAL HIGH (ref 11–51)

## 2024-02-13 MED ORDER — ONDANSETRON 4 MG PO TBDP
4.0000 mg | ORAL_TABLET | Freq: Once | ORAL | Status: AC
  Administered 2024-02-13 (×2): 4 mg via ORAL
  Filled 2024-02-13: qty 1

## 2024-02-13 MED ORDER — OXYCODONE-ACETAMINOPHEN 5-325 MG PO TABS
1.0000 | ORAL_TABLET | Freq: Once | ORAL | Status: AC
  Administered 2024-02-13 (×2): 1 via ORAL
  Filled 2024-02-13: qty 1

## 2024-02-13 MED ORDER — IOHEXOL 350 MG/ML SOLN
75.0000 mL | Freq: Once | INTRAVENOUS | Status: AC | PRN
  Administered 2024-02-13 (×2): 75 mL via INTRAVENOUS

## 2024-02-13 NOTE — ED Notes (Signed)
 Patient is being discharged from the Urgent Care and sent to the Emergency Department via POV with caregiver. Per Kyra Hoop, PA, patient is in need of higher level of care due to hypotension, diarrhea, abdominal pain. Patient is aware and verbalizes understanding of plan of care.  Vitals:   02/13/24 1744  BP: (!) 88/67  Pulse: 94  Resp: 18  SpO2: 97%

## 2024-02-13 NOTE — ED Provider Triage Note (Signed)
 Emergency Medicine Provider Triage Evaluation Note  KHRYSTIAN SCHAUF , a 53 y.o. male  was evaluated in triage.  Pt complains of abdominal pain, nausea, vomiting.  Review of Systems  Positive: Abdominal pain, nausea, vomiting Negative:   Physical Exam  BP 102/63 (BP Location: Right Arm)   Pulse 93   Temp 98.9 F (37.2 C)   Resp 14   SpO2 95%  Gen:   Awake, no distress   Resp:  Normal effort  MSK:   Moves extremities without difficulty  Other:  Focal ttp throughout the abdomen, worst in LUQ  Medical Decision Making  Medically screening exam initiated at 7:05 PM.  Appropriate orders placed.  LAMARIUS DIRR was informed that the remainder of the evaluation will be completed by another provider, this initial triage assessment does not replace that evaluation, and the importance of remaining in the ED until their evaluation is complete.  Workup initiated in triage    Rosan Sherlean VEAR DEVONNA 02/13/24 1906

## 2024-02-13 NOTE — Discharge Instructions (Signed)
 Go to the ER right now to have blood work and more tests which we can't do here

## 2024-02-13 NOTE — ED Provider Notes (Signed)
 [facility]    CSN: [id number] Arrival date & time: 02/13/24  1703      History   Chief Complaint No chief complaint on file.   HPI Richard Carpenter is a 53 y.o. male who presents with care giver with central abdominal pain that started yesterday, has been very nauseous and dry heaving  all day, but has not vomited. He has not eaten anything in the past 24 hours in fear it will hurts his stomach even more. Has had 2 episodes of diarrhea since this started. Denies having a fever, or URI symptoms.     Past Medical History:  Diagnosis Date   High cholesterol    Hypertension     Patient Active Problem List   Diagnosis Date Noted   Dental caries 05/09/2014    Past Surgical History:  Procedure Laterality Date   LEG SURGERY         Home Medications    Prior to Admission medications   Medication Sig Start Date End Date Taking? Authorizing Provider  rosuvastatin  (CRESTOR ) 20 MG tablet Take by mouth. 02/08/24  Yes [provider]  atenolol  (TENORMIN ) 100 MG tablet Take 100 mg by mouth daily.    [provider]  atorvastatin (LIPITOR) 40 MG tablet  07/05/18   [provider]  Diphenoxylate-Atropine (LOMOTIL PO) Take by mouth.    [provider]  fluticasone  (CUTIVATE ) 0.05 % cream Apply topically 2 (two) times daily. 09/07/15   Vincente Lynwood BIRCH, MD  gemfibrozil  (LOPID ) 600 MG tablet  07/05/18   [provider]  lisinopril-hydrochlorothiazide (PRINZIDE,ZESTORETIC) 20-25 MG tablet Take 1 tablet by mouth daily.    [provider]    Family History No family history on file.  Social History Social History   Tobacco Use   Smoking status: Never   Smokeless tobacco: Never  Substance Use Topics   Alcohol use: No     Allergies   Patient has no known allergies.   Review of Systems Review of Systems As noted in HPI  Physical Exam Triage Vital Signs ED Triage Vitals  Encounter Vitals Group     BP 02/13/24  1744 (!) 88/67     Girls Systolic BP Percentile --      Girls Diastolic BP Percentile --      Boys Systolic BP Percentile --      Boys Diastolic BP Percentile --      Pulse Rate 02/13/24 1744 94     Resp 02/13/24 1744 18     Temp --      Temp src --      SpO2 02/13/24 1744 97 %     Weight --      Height --      Head Circumference --      Peak Flow --      Pain Score 02/13/24 1742 9     Pain Loc --      Pain Education --      Exclude from Growth Chart --    No data found.  Updated Vital Signs BP (!) 88/67 (BP Location: Left Arm)   Pulse 94   Resp 18   SpO2 97%   Visual Acuity Right Eye Distance:   Left Eye Distance:   Bilateral Distance:    Right Eye Near:   Left Eye Near:    Bilateral Near:     Physical Exam Vitals and nursing note reviewed.  Constitutional:      Appearance: He  is ill-appearing.     Comments: Dry heaving in the room and I could hear him from next exam room  Eyes:     General: No scleral icterus.    Conjunctiva/sclera: Conjunctivae normal.  Pulmonary:     Effort: Pulmonary effort is normal.  Abdominal:     General: Abdomen is flat. Bowel sounds are decreased.     Palpations: There is no splenomegaly or mass.     Tenderness: There is abdominal tenderness in the periumbilical area and suprapubic area. There is guarding.      Comments: He could not lay down flat due to having a wave of severe pain that caused him to bend over  Musculoskeletal:        General: Normal range of motion.     Cervical back: Neck supple.  Skin:    General: Skin is warm.     Comments: He is a little clammy  Neurological:     Mental Status: He is alert and oriented to person, place, and time.     Gait: Gait normal.  Psychiatric:        Mood and Affect: Mood normal.      UC Treatments / Results  Labs (all labs ordered are listed, but only abnormal results are displayed) Labs Reviewed - No data to display  EKG   Radiology No results  found.  Procedures Procedures (including critical care time)  Medications Ordered in UC Medications - No data to display  Initial Impression / Assessment and Plan / UC Course  I have reviewed the triage vital signs and the nursing notes.  Acute abdominal pain with guarding Hypotension  He was sent to ER for further work up.  Final Clinical Impressions(s) / UC Diagnoses   Final diagnoses:  Suprapubic abdominal pain  Nausea without vomiting  Hypotension, unspecified hypotension type     Discharge Instructions      Go to the ER right now to have blood work and more tests which we can't do here    ED Prescriptions   None    PDMP not reviewed this encounter.   Lindi Carter, PA-C 02/13/24 1805

## 2024-02-13 NOTE — ED Triage Notes (Signed)
 Pt sent  from urgent care for further evaluation of abd pain, N/V x 2 days; endorses fevers; denies urinary issues

## 2024-02-13 NOTE — ED Triage Notes (Signed)
 Pt c/o generalized abdominal pain, having diarrhea and dry heaves. Caregiver was giving him clear electrolytes.

## 2024-02-14 DIAGNOSIS — Z6222 Institutional upbringing: Secondary | ICD-10-CM

## 2024-02-14 DIAGNOSIS — K859 Acute pancreatitis without necrosis or infection, unspecified: Secondary | ICD-10-CM | POA: Diagnosis not present

## 2024-02-14 DIAGNOSIS — D72829 Elevated white blood cell count, unspecified: Secondary | ICD-10-CM

## 2024-02-14 DIAGNOSIS — N179 Acute kidney failure, unspecified: Secondary | ICD-10-CM | POA: Diagnosis not present

## 2024-02-14 DIAGNOSIS — I10 Essential (primary) hypertension: Secondary | ICD-10-CM | POA: Diagnosis not present

## 2024-02-14 DIAGNOSIS — R6250 Unspecified lack of expected normal physiological development in childhood: Secondary | ICD-10-CM

## 2024-02-14 LAB — URINALYSIS, ROUTINE W REFLEX MICROSCOPIC
Bilirubin Urine: NEGATIVE
Glucose, UA: NEGATIVE mg/dL
Ketones, ur: NEGATIVE mg/dL
Leukocytes,Ua: NEGATIVE
Nitrite: NEGATIVE
Protein, ur: 100 mg/dL — AB
Specific Gravity, Urine: >1.046 — ABNORMAL HIGH (ref 1.005–1.030)
pH: 5 (ref 5.0–8.0)

## 2024-02-14 LAB — LIPID PANEL
Cholesterol: 121 mg/dL (ref 0–200)
HDL: 31 mg/dL — ABNORMAL LOW (ref >40)
LDL Cholesterol: 65 mg/dL (ref 0–99)
Total CHOL/HDL Ratio: 3.9 ratio
Triglycerides: 125 mg/dL (ref <150)
VLDL: 25 mg/dL (ref 0–40)

## 2024-02-14 LAB — HIV ANTIBODY (ROUTINE TESTING W REFLEX): HIV Screen 4th Generation wRfx: NONREACTIVE

## 2024-02-14 MED ORDER — ENOXAPARIN SODIUM 40 MG/0.4ML IJ SOSY
40.0000 mg | PREFILLED_SYRINGE | INTRAMUSCULAR | Status: DC
  Administered 2024-02-14 – 2024-02-19 (×7): 40 mg via SUBCUTANEOUS
  Filled 2024-02-14 (×6): qty 0.4

## 2024-02-14 MED ORDER — FENTANYL CITRATE PF 50 MCG/ML IJ SOSY
50.0000 ug | PREFILLED_SYRINGE | Freq: Once | INTRAMUSCULAR | Status: AC
  Administered 2024-02-14 (×2): 50 ug via INTRAVENOUS
  Filled 2024-02-14: qty 1

## 2024-02-14 MED ORDER — HYDROMORPHONE HCL 1 MG/ML IJ SOLN
0.5000 mg | INTRAMUSCULAR | Status: DC | PRN
  Administered 2024-02-14 (×4): 0.5 mg via INTRAVENOUS
  Filled 2024-02-14: qty 1
  Filled 2024-02-14: qty 0.5

## 2024-02-14 MED ORDER — ONDANSETRON HCL 4 MG/2ML IJ SOLN
4.0000 mg | Freq: Once | INTRAMUSCULAR | Status: AC
  Administered 2024-02-14 (×2): 4 mg via INTRAVENOUS
  Filled 2024-02-14: qty 2

## 2024-02-14 MED ORDER — LACTATED RINGERS IV SOLN: INTRAVENOUS | Status: AC

## 2024-02-14 MED ORDER — SODIUM CHLORIDE 0.9 % IV BOLUS
1000.0000 mL | Freq: Once | INTRAVENOUS | Status: AC
  Administered 2024-02-14 (×2): 1000 mL via INTRAVENOUS

## 2024-02-14 MED ORDER — ONDANSETRON HCL 4 MG/2ML IJ SOLN
4.0000 mg | Freq: Four times a day (QID) | INTRAMUSCULAR | Status: DC | PRN
  Administered 2024-02-15 – 2024-02-16 (×2): 4 mg via INTRAVENOUS
  Filled 2024-02-14 (×2): qty 2

## 2024-02-14 NOTE — Group Note (Deleted)
 Date:  02/14/2024 Time:  2:22 PM  Group Topic/Focus:  Wellness Toolbox:   The focus of this group is to discuss various aspects of wellness, balancing those aspects and exploring ways to increase the ability to experience wellness.  Patients will create a wellness toolbox for use upon discharge.     Participation Level:  {B[facility] PARTICIPATION OZCZO:77735}  Participation Quality:  {BHH PARTICIPATION QUALITY:22265}  Affect:  {BHH AFFECT:22266}  Cognitive:  {BHH COGNITIVE:22267}  Insight: {BHH Insight2:20797}  Engagement in Group:  {BHH ENGAGEMENT IN HMNLE:77731}  Modes of Intervention:  {BHH MODES OF INTERVENTION:22269}  Additional Comments:  ***  Richard Carpenter 02/14/2024, 2:22 PM

## 2024-02-14 NOTE — Hospital Course (Addendum)
##  Acute pancreatitis  #Nausea and vomiting  #Elevated LFTs Patient presented after 2 days of nausea, vomiting, and diarrhea. Vitals on presentation in the ED significant for BP 90/60, with labs showing leukocytosis to 35.3, lipase 120, Scr 2.49. CTAP showed acute interstitial/edematous pancreatitis without pancreatic necrosis and loculated collection with noted cholelithiasis. He received IVF, antiemetics and analgesics. Symptoms improved throughout admission, with resolution of nausea, no further episodes of emesis, and reduction of pain. Leukocytosis improved from 35.3 to 18.6 by day of discharge. ALP and Tbili were borderline elevated on admission. AST and ALT were elevated on 8/15. Tbili normalized, but ALP, AST and ALT continued to increase slowly, were still mildly elevated (~2x upper normal limit) at discharge. His rosuvastatin  was held in the setting of transaminitis. His diet was slowly advanced and he was able to tolerate a regular diet by day of discharge. He had some mild diarrhea which improved with loperamide .  - Follow up with outpatient PCP for leukocytosis and transaminitis and Consider general surgery outpatient referral - Consider restarting statin with resolution of elevated LFTs   #Leukocytosis WBC on admission was 35.3 and improved to 18.6 by day of discharge. He has remained afebrile throughout admission. No respiratory symptoms, lung exam clear and saturated well on RA. Denied urinary symptoms such as dysuria or hematuria throughout admission.   - Follow up with outpatient PCP for leukocytosis.   #AKI, resolved  Scr decreased from 2.49 to 1.42. Unclear of baseline given no records so unclear if patient has hx of CKD. Patient denied hx of CKD. Admission BUN creatinine ratio of 18. UA with moderate Hgb, proteinuria and few bacteria - asymptomatic bacteruria. CT without hydronephrosis or calculi. He continues to deny any UTI or obstructive symptoms as above. FENa of 1.4% intermediate  indicating prerenal or intrinsic etiology. Suspect prerenal AKI in setting of HTN medications and poor po intake due to n/v, as well as improvement of Scr with fluid administration. He received IVF and then when able, oral fluid intake was encouraged. Nephrotoxic agents and home anti-hypertensive medications were held. By day of discharge, AKI resolved with Cr of 1.16.    #HTN Med list reports lisinopril-hydrochlorothiazide 20-25 mg and atenolol  100 mg daily. He has also had low-normal BP during admission and likely AKI, so held home antihypertensives in setting of AKI and low-normal BP. He was restarted on Atenolol  100 mg daily by 8/17. By day of discharge he continued to have low-normal BP around 101/61, so advised him not to take lisinopril-hydrochlorothiazide until follow up with PCP.  - Follow up with outpatient PCP for reassessment of HTN and restarting medications if appropriate   #HLD Takes Rosuvastatin  20 mg daily and Gemfibrozil  600 mg twice daily at home. Lipid panel showed low HDL, otherwise unremarkable. LFTs elevated as above. Held home lipid lowering drugs as above and restart once transaminitis and symptoms improve.  - Follow up with outpatient PCP for transaminitis

## 2024-02-14 NOTE — H&P (Signed)
 Date: 02/14/2024               Patient Name:  Richard Carpenter MRN: [id number]  DOB: 03-29-1971 Age / Sex: 53 y.o., male   PCP: Loring Tanda Mae, MD         Medical Service: Internal Medicine Teaching Service         Attending Physician: Dr. Dreama Longs, MD      First Contact: Doyal Miyamoto, MD     Pager:  [phone]  Second Contact: Dr. Fairy Pool, DO   Pager:  [phone]       After Hours  (After 5pm / First Contact Pager: [phone]  weekends / holidays): Second Contact Pager: [phone]   SUBJECTIVE   Chief Complaint: epigastric pain  History of Present Illness: Richard Carpenter is a 53 y.o. male with PMH of HTN, HLD.   Presents with epigastric pain x 2 days (Monday). He is living in a group home and the manager helps manages his medical problems and medications. Describes as sharp pain. Associated with nausea and vomiting and loose stools. Denies any BM this morning and states last BM was Monday. Denies fever or chills. Denies sick contacts or recent illness. Denies recent injury or trauma. Denies any insect bites. Endorses poor appetite but tolerating po fluids. Denies urinary symptoms including dysuria or hematuria. Denies SOB, nasal congestion, chest pain. Had a cough but none at this time. Patient denies any new dietary changes or medications. However, he is unable to recall his medication list.   No one at bedside with patient. States group home manager knows his medications but unsuccessful at contacting her. Has a brother and sister but no contact info listed at this time.    ED Course: Vitals were BP 90/60 (70), HR 79, Temp 98.0, on RA Labs significant for Scr 2.49 and BUN 46, bicarb 19 and AG 17, ALP 136, lipase 120, total bili 1.3, WBC 35.3 Imaging CT a/p with acute interstitial/edematous pancreatitis w/o pancreatic necrosis and loculated collection, noted cholelithiasis Received NS 1L bolus Consulted IMTS for medical admission   Meds:  -Patient unsure of his  medication list. States group home manager assists with his medications. Attempted to call x 4 without response/unable to leave voicemail -?atenolol  100 mg daily -?atorvastatin 40 mg daily -?rosuvastatin  20 mg daily -?gemfibrozil  600 mg BID -?lisinopril-hydrochlorothiazide 20-25 mg daily  No outpatient medications have been marked as taking for the 02/13/24 encounter [facility] Encounter).    Past Medical History -HTN -HLD Past Medical History:  Diagnosis Date   High cholesterol    Hypertension     Past Surgical History Past Surgical History:  Procedure Laterality Date   LEG SURGERY      Social:  Lives With: Group Home  Support: group home staff, sister and brother  Level of Function: independent with ADL PCP: Loring Tanda Mae, MD Substances: -Tobacco: denies  -Alcohol: denies -Recreational Drug: denies  Family History:  History reviewed. No pertinent family history.  Allergies: Allergies as of 02/13/2024   (No Known Allergies)   Review of Systems: A complete ROS was negative except as per HPI.   OBJECTIVE:   Physical Exam: Blood pressure 90/60, pulse 79, temperature 98 F (36.7 C), temperature source Oral, resp. rate 20, weight 91.6 kg, SpO2 98% on RA.  Constitutional: alert, laying in bed, in no acute distress HENT: mucous membranes dry Cardiovascular: regular rate and rhythm Pulmonary/Chest: normal work of breathing on room air, lungs clear to auscultation bilaterally Abdominal: bowel  sounds present, soft, non-distended, TTP worse at RUQ and epigastric, guarding with deep palpation, no rebound  MSK: no LE edema Neurological: alert & oriented x 3 Skin: warm and dry  Labs: CBC    Component Value Date/Time   WBC 35.3 (H) 02/13/2024 1905   RBC 4.49 02/13/2024 1905   HGB 13.6 02/13/2024 1905   HCT 39.2 02/13/2024 1905   PLT 387 02/13/2024 1905   MCV 87.3 02/13/2024 1905   MCH 30.3 02/13/2024 1905   MCHC 34.7 02/13/2024 1905   RDW 13.6 02/13/2024  1905     CMP     Component Value Date/Time   NA 137 02/13/2024 1905   K 4.0 02/13/2024 1905   CL 101 02/13/2024 1905   CO2 19 (L) 02/13/2024 1905   GLUCOSE 124 (H) 02/13/2024 1905   BUN 46 (H) 02/13/2024 1905   CREATININE 2.49 (H) 02/13/2024 1905   CALCIUM  8.9 02/13/2024 1905   PROT 8.3 (H) 02/13/2024 1905   ALBUMIN 3.4 (L) 02/13/2024 1905   AST 33 02/13/2024 1905   ALT 20 02/13/2024 1905   ALKPHOS 136 (H) 02/13/2024 1905   BILITOT 1.3 (H) 02/13/2024 1905   GFRNONAA 30 (L) 02/13/2024 1905    Imaging:  CT ABDOMEN PELVIS W CONTRAST CLINICAL DATA:  Acute nonlocalized abdominal pain  EXAM: CT ABDOMEN AND PELVIS WITH CONTRAST  TECHNIQUE: Multidetector CT imaging of the abdomen and pelvis was performed using the standard protocol following bolus administration of intravenous contrast.  RADIATION DOSE REDUCTION: This exam was performed according to the departmental dose-optimization program which includes automated exposure control, adjustment of the mA and/or kV according to patient size and/or use of iterative reconstruction technique.  CONTRAST:  75mL OMNIPAQUE  IOHEXOL  350 MG/ML SOLN  COMPARISON:  None Available.  FINDINGS: Lower chest: No acute abnormality. Moderate coronary artery calcification  Hepatobiliary: Cholelithiasis without superimposed pericholecystic inflammatory change. Liver unremarkable; no enhancing intrahepatic mass identified. No intra or extrahepatic biliary ductal dilation.  Pancreas: There is extensive peripancreatic inflammatory stranding and edema, asymmetric surrounding the body, head, and uncinate process of the pancreas with acute inflammatory peripancreatic fluid seen tracking inferiorly to surround the third portion of the duodenum. Findings are in keeping with acute interstitial/edematous pancreatitis. No loculated fluid collections are seen. There is normal enhancement of the pancreatic parenchyma. The pancreatic duct is not  dilated.  Spleen: Unremarkable  Adrenals/Urinary Tract: Adrenal glands are unremarkable. Kidneys are normal, without renal calculi, focal lesion, or hydronephrosis. Bladder is unremarkable.  Stomach/Bowel: Mild sigmoid diverticulosis. Circumferential bowel wall thickening and edema involving the second portion of the duodenum related to the adjacent inflammatory process within pancreas. Stomach, small bowel, and large bowel are otherwise unremarkable. Appendix normal. No evidence of obstruction or focal inflammation. No free intraperitoneal gas or fluid.  Vascular/Lymphatic: No significant vascular findings are present. No enlarged abdominal or pelvic lymph nodes.  Reproductive: Prostate is unremarkable.  Other: No abdominal wall hernia or abnormality. No abdominopelvic ascites.  Musculoskeletal: No acute or significant osseous findings.  IMPRESSION: 1. Acute interstitial/edematous pancreatitis. No evidence of pancreatic necrosis or loculated peripancreatic fluid collection. 2. Cholelithiasis. 3. Mild sigmoid diverticulosis. 4. Moderate coronary artery calcification.  Electronically Signed   By: Dorethia Molt M.D.   On: 02/13/2024 22:13   EKG: personally reviewed my interpretation is sinus rhythm, normal axis, normal intervals. No prior EKG to compare.   ASSESSMENT & PLAN:   Assessment & Plan by Problem: Principal Problem:   Acute pancreatitis   Richard Carpenter is a  53 y.o. person living with a history of HTN and HLD who presented with abdominal pain and admitted for acute pancreatitis on hospital day 0  #Acute pancreatitis  #Nausea and vomiting  #Elevated ALP  Lipase 120 and exam findings of RUQ and epigastric TTP along with CT findings of acute pancreatitis. No necrosis or abscess collection. Noted leukocytosis of 35.3 without fevers. Denies EtOH. Triglycerides normal. Denies recent trauma, new medications. CT a/p noted cholelithiasis w/o pericholecystic  inflammatory changes. ?pancreatitis from gallstones. Alk phos mildly elevated 136. May need general surgery consult once symptoms improved.  - S/p NS 1L bolus, start LR at 135 ml/hr x 1 day - Pain control: IV dilaudid  PRN  - NPO for now, can ADAT with improved of n/v - PRN IV Zofran  (Qtc 463) - Trend CMP tomorrow - Consider general surgery consult with symptom improvement   #Leukocytosis WBC of 35.3 on admission. Afebrile and denies fevers. No sick contacts. No respiratory symptoms, lung exam clear and saturating well on RA. Denies urinary symptoms such as dysuria or hematuria.  - Trend CBC w diff  - Trend temperature  - If fevers, then consider blood cultures, check CXR and empiric abx   #AKI  Scr 2.49. Unclear of baseline given no records so unclear if patient has hx of CKD. Patient denies hx of CKD. BUN creatinine ratio of 18. Denies any obstructive urinary symptoms at this time. Reports voiding fine. Denies UTI symptoms at this time. UA with moderate Hgb, proteinuria and few bacteria - asymptomatic bacteruria. CT without hydronephrosis or calculi. Suspect AKI in setting of pre-renal and poor po intake due to n/v. Start with IVF and reassess.  - On IVF as above - Trend BMP/renal function, monitor I&Os - If no improvement with IVF, consider obtaining urine studies to calculate FENa  - Avoid nephrotoxic agents and hold home antihypertensives   #HTN Med list reports lisinopril-hydrochlorothiazide 20-25 mg and atenolol  100 mg but patient cannot confirm if he takes these. Will need reconciliation of medications when able to reach group home manager.  - Hold home antihypertensive until med rec and in setting of AKI  #HLD Med list reports both atorvastatin and rosuvastatin  but patient not sure what he takes. Pending med rec from group home manager. Lipid panel unremarkable today.  - Hold home statin and possible gemfibrozil  until med rec   Diet: NPO but can advance diet to CLD if no further  n/v VTE ppx: Enoxaparin  IVF: LR,135 ml/hr x 1 day Abx: none  Code Status: Full Surrogate Decision Maker: Avelina Crete (relationship: group home manager)  Prior to Admission Living Arrangement: Home, living at group home Anticipated Discharge Location: pending Barriers to Discharge: medical stability  Dispo: Admit patient to Observation with expected length of stay less than 2 midnights.  Signed: Elicia Sharper, DO Internal Medicine Resident PGY-3 02/14/2024, 8:48 AM   Please contact IM Residency On-Call Pager at: [phone] or [phone].

## 2024-02-14 NOTE — ED Provider Notes (Addendum)
 [facility] AT [facility] Provider Note   CSN: [id number] Arrival date & time: 02/13/24  1806     Patient presents with: Abdominal Pain  HPI Richard Carpenter is a 54 y.o. male with history of hypertension and high cholesterol presenting for abdominal pain.  Started 2 days ago with nausea, vomiting and diarrhea.  Pain is located in the epigastric region and radiates to the umbilicus.  Does not radiate to the back.  Denies chest pain or shortness of breath.  Denies fever.  Denies heavy alcohol use.  Denies urinary symptoms or fever.     Abdominal Pain      Prior to Admission medications   Medication Sig Start Date End Date Taking? Authorizing Provider  atenolol  (TENORMIN ) 100 MG tablet Take 100 mg by mouth daily.    [provider]  atorvastatin (LIPITOR) 40 MG tablet  07/05/18   [provider]  Diphenoxylate-Atropine (LOMOTIL PO) Take by mouth.    [provider]  fluticasone  (CUTIVATE ) 0.05 % cream Apply topically 2 (two) times daily. 09/07/15   Vincente Lynwood BIRCH, MD  gemfibrozil  (LOPID ) 600 MG tablet  07/05/18   [provider]  lisinopril-hydrochlorothiazide (PRINZIDE,ZESTORETIC) 20-25 MG tablet Take 1 tablet by mouth daily.    [provider]  rosuvastatin  (CRESTOR ) 20 MG tablet Take by mouth. 02/08/24   [provider]    Allergies: Patient has no known allergies.    Review of Systems  Gastrointestinal:  Positive for abdominal pain.    Updated Vital Signs BP 90/60 (BP Location: Right Arm)   Pulse 79   Temp 98 F (36.7 C) (Oral)   Resp 20   Wt 91.6 kg   SpO2 98%   Physical Exam Vitals and nursing note reviewed.  HENT:     Head: Normocephalic and atraumatic.     Mouth/Throat:     Mouth: Mucous membranes are moist.  Eyes:     General:        Right eye: No discharge.        Left eye: No discharge.     Conjunctiva/sclera: Conjunctivae normal.  Cardiovascular:     Rate and Rhythm:  Normal rate and regular rhythm.     Pulses: Normal pulses.     Heart sounds: Normal heart sounds.  Pulmonary:     Effort: Pulmonary effort is normal.     Breath sounds: Normal breath sounds.  Abdominal:     General: Abdomen is flat.     Palpations: Abdomen is soft.     Tenderness: There is abdominal tenderness in the epigastric area.  Skin:    General: Skin is warm and dry.  Neurological:     General: No focal deficit present.  Psychiatric:        Mood and Affect: Mood normal.     (all labs ordered are listed, but only abnormal results are displayed) Labs Reviewed  LIPASE, BLOOD - Abnormal; Notable for the following components:      Result Value   Lipase 120 (*)    All other components within normal limits  COMPREHENSIVE METABOLIC PANEL WITH GFR - Abnormal; Notable for the following components:   CO2 19 (*)    Glucose, Bld 124 (*)    BUN 46 (*)    Creatinine, Ser 2.49 (*)    Total Protein 8.3 (*)    Albumin 3.4 (*)    Alkaline Phosphatase 136 (*)    Total Bilirubin 1.3 (*)  GFR, Estimated 30 (*)    Anion gap 17 (*)    All other components within normal limits  CBC - Abnormal; Notable for the following components:   WBC 35.3 (*)    All other components within normal limits  URINALYSIS, ROUTINE W REFLEX MICROSCOPIC    EKG: None  Radiology: CT ABDOMEN PELVIS W CONTRAST Result Date: 02/13/2024 CLINICAL DATA:  Acute nonlocalized abdominal pain EXAM: CT ABDOMEN AND PELVIS WITH CONTRAST TECHNIQUE: Multidetector CT imaging of the abdomen and pelvis was performed using the standard protocol following bolus administration of intravenous contrast. RADIATION DOSE REDUCTION: This exam was performed according to the departmental dose-optimization program which includes automated exposure control, adjustment of the mA and/or kV according to patient size and/or use of iterative reconstruction technique. CONTRAST:  75mL OMNIPAQUE  IOHEXOL  350 MG/ML SOLN COMPARISON:  None Available.  FINDINGS: Lower chest: No acute abnormality. Moderate coronary artery calcification Hepatobiliary: Cholelithiasis without superimposed pericholecystic inflammatory change. Liver unremarkable; no enhancing intrahepatic mass identified. No intra or extrahepatic biliary ductal dilation. Pancreas: There is extensive peripancreatic inflammatory stranding and edema, asymmetric surrounding the body, head, and uncinate process of the pancreas with acute inflammatory peripancreatic fluid seen tracking inferiorly to surround the third portion of the duodenum. Findings are in keeping with acute interstitial/edematous pancreatitis. No loculated fluid collections are seen. There is normal enhancement of the pancreatic parenchyma. The pancreatic duct is not dilated. Spleen: Unremarkable Adrenals/Urinary Tract: Adrenal glands are unremarkable. Kidneys are normal, without renal calculi, focal lesion, or hydronephrosis. Bladder is unremarkable. Stomach/Bowel: Mild sigmoid diverticulosis. Circumferential bowel wall thickening and edema involving the second portion of the duodenum related to the adjacent inflammatory process within pancreas. Stomach, small bowel, and large bowel are otherwise unremarkable. Appendix normal. No evidence of obstruction or focal inflammation. No free intraperitoneal gas or fluid. Vascular/Lymphatic: No significant vascular findings are present. No enlarged abdominal or pelvic lymph nodes. Reproductive: Prostate is unremarkable. Other: No abdominal wall hernia or abnormality. No abdominopelvic ascites. Musculoskeletal: No acute or significant osseous findings. IMPRESSION: 1. Acute interstitial/edematous pancreatitis. No evidence of pancreatic necrosis or loculated peripancreatic fluid collection. 2. Cholelithiasis. 3. Mild sigmoid diverticulosis. 4. Moderate coronary artery calcification. Electronically Signed   By: Dorethia Molt M.D.   On: 02/13/2024 22:13     .Critical Care  Performed by:  Lang Norleen POUR, PA-C Authorized by: Lang Norleen POUR, PA-C   Critical care provider statement:    Critical care time (minutes):  30   Critical care was necessary to treat or prevent imminent or life-threatening deterioration of the following conditions:  Renal failure   Critical care was time spent personally by me on the following activities:  Development of treatment plan with patient or surrogate, discussions with consultants, evaluation of patient's response to treatment, examination of patient, ordering and review of laboratory studies, ordering and review of radiographic studies, ordering and performing treatments and interventions, pulse oximetry, re-evaluation of patient's condition and review of old charts    Medications Ordered in the ED  oxyCODONE -acetaminophen  (PERCOCET/ROXICET) 5-325 MG per tablet 1 tablet (1 tablet Oral Given 02/13/24 1919)  ondansetron  (ZOFRAN -ODT) disintegrating tablet 4 mg (4 mg Oral Given 02/13/24 1919)  iohexol  (OMNIPAQUE ) 350 MG/ML injection 75 mL (75 mLs Intravenous Contrast Given 02/13/24 2121)  sodium chloride  0.9 % bolus 1,000 mL (1,000 mLs Intravenous New Bag/Given 02/14/24 0853)  ondansetron  (ZOFRAN ) injection 4 mg (4 mg Intravenous Given 02/14/24 0852)  fentaNYL  (SUBLIMAZE ) injection 50 mcg (50 mcg Intravenous Given 02/14/24 0853)  Medical Decision Making Amount and/or Complexity of Data Reviewed Labs: ordered.  Risk Prescription drug management. Decision regarding hospitalization.   Initial Impression and Ddx 53 year old well-appearing male presenting for abdominal pain.  Exam notable for epigastric tenderness.  DDx includes acute pancreatitis, gallbladder pathology, ACS, kidney stone, sepsis, other. Patient PMH that increases complexity of ED encounter:  HTN, high cholesterol  Interpretation of Diagnostics - I independent reviewed and interpreted the labs as followed: Elevated lipase, leukocytosis, reduced  GFR, elevated alk phos  - I independently visualized the following imaging with scope of interpretation limited to determining acute life threatening conditions related to emergency care: CT, which revealed acute pancreatitis  Patient Reassessment and Ultimate Disposition/Management Workup suggestive of acute pancreatitis.  Also revealing reduced renal function.  No prior labs to compare in the chart.  Suspect this is likely an AKI secondary to vomiting and diarrhea in the last 2 days but could be chronic as well.  Admitted to hospital service.  He remains well-appearing, no acute distress and hemodynamically stable.  Pertinent history reviewed in his chart.  Patient management required discussion with the following services or consulting groups:  Hospitalist Service  Complexity of Problems Addressed Acute complicated illness or Injury  Additional Data Reviewed and Analyzed Further history obtained from: Past medical history and medications listed in the EMR and Prior ED visit notes  Patient Encounter Risk Assessment Consideration of hospitalization      Final diagnoses:  Acute pancreatitis, unspecified complication status, unspecified pancreatitis type    ED Discharge Orders     None          Haley Roza K, PA-C 02/14/24 9092    Carita Senior, MD 02/14/24 2106

## 2024-02-15 DIAGNOSIS — D72829 Elevated white blood cell count, unspecified: Secondary | ICD-10-CM | POA: Diagnosis not present

## 2024-02-15 DIAGNOSIS — R6250 Unspecified lack of expected normal physiological development in childhood: Secondary | ICD-10-CM | POA: Diagnosis present

## 2024-02-15 DIAGNOSIS — R7989 Other specified abnormal findings of blood chemistry: Secondary | ICD-10-CM

## 2024-02-15 DIAGNOSIS — Z6831 Body mass index (BMI) 31.0-31.9, adult: Secondary | ICD-10-CM | POA: Diagnosis not present

## 2024-02-15 DIAGNOSIS — E785 Hyperlipidemia, unspecified: Secondary | ICD-10-CM | POA: Diagnosis not present

## 2024-02-15 DIAGNOSIS — R197 Diarrhea, unspecified: Secondary | ICD-10-CM | POA: Diagnosis present

## 2024-02-15 DIAGNOSIS — I10 Essential (primary) hypertension: Secondary | ICD-10-CM | POA: Diagnosis present

## 2024-02-15 DIAGNOSIS — R809 Proteinuria, unspecified: Secondary | ICD-10-CM | POA: Diagnosis present

## 2024-02-15 DIAGNOSIS — N179 Acute kidney failure, unspecified: Secondary | ICD-10-CM | POA: Diagnosis present

## 2024-02-15 DIAGNOSIS — K8020 Calculus of gallbladder without cholecystitis without obstruction: Secondary | ICD-10-CM | POA: Diagnosis present

## 2024-02-15 DIAGNOSIS — Z79899 Other long term (current) drug therapy: Secondary | ICD-10-CM | POA: Diagnosis not present

## 2024-02-15 DIAGNOSIS — R630 Anorexia: Secondary | ICD-10-CM | POA: Diagnosis present

## 2024-02-15 DIAGNOSIS — R1013 Epigastric pain: Secondary | ICD-10-CM | POA: Diagnosis present

## 2024-02-15 DIAGNOSIS — K859 Acute pancreatitis without necrosis or infection, unspecified: Secondary | ICD-10-CM | POA: Diagnosis not present

## 2024-02-15 DIAGNOSIS — R7401 Elevation of levels of liver transaminase levels: Secondary | ICD-10-CM | POA: Diagnosis present

## 2024-02-15 DIAGNOSIS — R8271 Bacteriuria: Secondary | ICD-10-CM | POA: Diagnosis present

## 2024-02-15 DIAGNOSIS — R609 Edema, unspecified: Secondary | ICD-10-CM | POA: Diagnosis present

## 2024-02-15 DIAGNOSIS — K851 Biliary acute pancreatitis without necrosis or infection: Secondary | ICD-10-CM | POA: Diagnosis present

## 2024-02-15 DIAGNOSIS — R000 Tachycardia, unspecified: Secondary | ICD-10-CM | POA: Diagnosis not present

## 2024-02-15 DIAGNOSIS — E7800 Pure hypercholesterolemia, unspecified: Secondary | ICD-10-CM | POA: Diagnosis present

## 2024-02-15 LAB — CBC WITH DIFFERENTIAL/PLATELET
Abs Immature Granulocytes: 0.2 K/uL — ABNORMAL HIGH (ref 0.00–0.07)
Basophils Absolute: 0.1 K/uL (ref 0.0–0.1)
Basophils Relative: 0 %
Eosinophils Absolute: 0 K/uL (ref 0.0–0.5)
Eosinophils Relative: 0 %
HCT: 31.7 % — ABNORMAL LOW (ref 39.0–52.0)
Hemoglobin: 10.8 g/dL — ABNORMAL LOW (ref 13.0–17.0)
Immature Granulocytes: 1 %
Lymphocytes Relative: 4 %
Lymphs Abs: 0.8 K/uL (ref 0.7–4.0)
MCH: 30.3 pg (ref 26.0–34.0)
MCHC: 34.1 g/dL (ref 30.0–36.0)
MCV: 89 fL (ref 80.0–100.0)
Monocytes Absolute: 1.4 K/uL — ABNORMAL HIGH (ref 0.1–1.0)
Monocytes Relative: 7 %
Neutro Abs: 18 K/uL — ABNORMAL HIGH (ref 1.7–7.7)
Neutrophils Relative %: 88 %
Platelets: 216 K/uL (ref 150–400)
RBC: 3.56 MIL/uL — ABNORMAL LOW (ref 4.22–5.81)
RDW: 13.5 % (ref 11.5–15.5)
WBC: 20.5 K/uL — ABNORMAL HIGH (ref 4.0–10.5)
nRBC: 0 % (ref 0.0–0.2)

## 2024-02-15 LAB — COMPREHENSIVE METABOLIC PANEL WITH GFR
ALT: 20 U/L (ref 0–44)
AST: 32 U/L (ref 15–41)
Albumin: 2.4 g/dL — ABNORMAL LOW (ref 3.5–5.0)
Alkaline Phosphatase: 174 U/L — ABNORMAL HIGH (ref 38–126)
Anion gap: 14 (ref 5–15)
BUN: 54 mg/dL — ABNORMAL HIGH (ref 6–20)
CO2: 18 mmol/L — ABNORMAL LOW (ref 22–32)
Calcium: 7.9 mg/dL — ABNORMAL LOW (ref 8.9–10.3)
Chloride: 104 mmol/L (ref 98–111)
Creatinine, Ser: 2.89 mg/dL — ABNORMAL HIGH (ref 0.61–1.24)
GFR, Estimated: 26 mL/min — ABNORMAL LOW (ref >60)
Glucose, Bld: 90 mg/dL (ref 70–99)
Potassium: 3.7 mmol/L (ref 3.5–5.1)
Sodium: 136 mmol/L (ref 135–145)
Total Bilirubin: 1.1 mg/dL (ref 0.0–1.2)
Total Protein: 6.2 g/dL — ABNORMAL LOW (ref 6.5–8.1)

## 2024-02-15 MED ORDER — ACETAMINOPHEN 325 MG PO TABS: 650.0000 mg | ORAL_TABLET | Freq: Four times a day (QID) | ORAL | Status: DC | PRN

## 2024-02-15 MED ORDER — OXYCODONE HCL 5 MG PO TABS: 5.0000 mg | ORAL_TABLET | Freq: Four times a day (QID) | ORAL | Status: DC | PRN

## 2024-02-15 MED ORDER — POLYETHYLENE GLYCOL 3350 17 G PO PACK: 17.0000 g | PACK | Freq: Every day | ORAL | Status: DC | PRN

## 2024-02-15 MED ORDER — GEMFIBROZIL 600 MG PO TABS
600.0000 mg | ORAL_TABLET | Freq: Two times a day (BID) | ORAL | Status: DC
  Administered 2024-02-15: 600 mg via ORAL
  Filled 2024-02-15: qty 1

## 2024-02-15 MED ORDER — ROSUVASTATIN CALCIUM 20 MG PO TABS
20.0000 mg | ORAL_TABLET | Freq: Every day | ORAL | Status: DC
  Administered 2024-02-15 – 2024-02-16 (×2): 20 mg via ORAL
  Filled 2024-02-15 (×2): qty 1

## 2024-02-15 NOTE — Progress Notes (Addendum)
 HD#0 SUBJECTIVE:  Patient Summary: Richard Carpenter is a 53 y.o. with a pertinent PMH of HLD and HTN, who presented with abdominal pain for 2 days and admitted for acute pancreatitis.   Overnight Events: None  Interim History: Patient reports his pain is slightly better but has been taking pain medicines. Belly pain is bouncing up and down but is intermittent. Mostly localized to the upper abdomen, especially on the LUQ. Denies nausea or vomiting this morning. Had water this morning and felt fine afterwards. He has been able to pee normally but not had a BM since Monday. Slept well last night. Denies any NSAID use in the group home. He says his group home manager should be coming up to the hospital later today to bring him clothing.  OBJECTIVE:  Vital Signs: Vitals:   02/14/24 1610 02/14/24 1900 02/14/24 2009 02/15/24 0528  BP: 106/65 107/68 (!) 103/58 112/66  Pulse: 87 95 98 93  Resp: (!) 22 18 17 16   Temp: 98.7 F (37.1 C) 99.6 F (37.6 C) 100.1 F (37.8 C) 98.5 F (36.9 C)  TempSrc: Oral Oral Oral Oral  SpO2: 98% 94% 97% 100%  Weight:      Height:       Supplemental O2: Room Air SpO2: 100 %  Filed Weights   02/13/24 1917  Weight: 91.6 kg     Intake/Output Summary (Last 24 hours) at 02/15/2024 0939 Last data filed at 02/15/2024 0400 Gross per 24 hour  Intake 2430.3 ml  Output 275 ml  Net 2155.3 ml   Net IO Since Admission: 2,155.3 mL [02/15/24 0939]  Physical Exam: Physical Exam Cardiovascular:     Rate and Rhythm: Normal rate and regular rhythm.  Pulmonary:     Effort: Pulmonary effort is normal.     Breath sounds: Normal breath sounds.  Abdominal:     General: Bowel sounds are normal.     Palpations: Abdomen is soft.     Tenderness: There is abdominal tenderness in the epigastric area and left upper quadrant.  Neurological:     Mental Status: He is alert.     Patient Lines/Drains/Airways Status     Active Line/Drains/Airways     Name Placement  date Placement time Site Days   Peripheral IV 02/13/24 20 G Left Antecubital 02/13/24  1916  Antecubital  2            Pertinent labs and imaging:      Latest Ref Rng & Units 02/15/2024    6:25 AM 02/13/2024    7:05 PM  CBC  WBC 4.0 - 10.5 K/uL 20.5  35.3   Hemoglobin 13.0 - 17.0 g/dL 89.1  86.3   Hematocrit 39.0 - 52.0 % 31.7  39.2   Platelets 150 - 400 K/uL 216  387        Latest Ref Rng & Units 02/15/2024    6:25 AM 02/13/2024    7:05 PM  CMP  Glucose 70 - 99 mg/dL 90  875   BUN 6 - 20 mg/dL 54  46   Creatinine 9.38 - 1.24 mg/dL 7.10  7.50   Sodium 864 - 145 mmol/L 136  137   Potassium 3.5 - 5.1 mmol/L 3.7  4.0   Chloride 98 - 111 mmol/L 104  101   CO2 22 - 32 mmol/L 18  19   Calcium  8.9 - 10.3 mg/dL 7.9  8.9   Total Protein 6.5 - 8.1 g/dL 6.2  8.3   Total Bilirubin  0.0 - 1.2 mg/dL 1.1  1.3   Alkaline Phos 38 - 126 U/L 174  136   AST 15 - 41 U/L 32  33   ALT 0 - 44 U/L 20  20     No results found.  ASSESSMENT/PLAN:  Assessment: Principal Problem:   Acute pancreatitis  Richard Carpenter is a 53 y.o. person living with a history of HTN and HLD who presented with abdominal pain and admitted for acute pancreatitis, now improving.   Plan: ##Acute pancreatitis  #Nausea and vomiting  #Elevated ALP  Symptoms have improved from yesterday as per patient, less pain, no N/V with oral fluid intake. Leukocytosis improved significantly but is still elevated (35.3 to 20.5). BUN (46 to 54), Scr (2.49 to 2.89) and Alk Phos (136 to 174) have increased from yesterday. Will plan to advance diet and step down to oral analgesics.  - Pain control: Oral acetaminophen  - Clear liquids this morning, ADAT with improved of n/v - Stop IVF - Trend CBC with diff, CMP tomorrow - Consider general surgery consult with symptom improvement   #Leukocytosis WBC improving as above. He has remained afebrile. No respiratory symptoms, lung exam clear and saturating well on RA. Denies urinary  symptoms such as dysuria or hematuria.  - Trend CBC - Trend temperature  - If fevers, then consider blood cultures, check CXR and empiric abx    #AKI  Scr increased as above. Unclear of baseline given no records so unclear if patient has hx of CKD. Patient denies hx of CKD. BUN creatinine ratio of 18. UA with moderate Hgb, proteinuria and few bacteria - asymptomatic bacteruria. CT without hydronephrosis or calculi. He continues to deny any UTI or obstructive symptoms as above. Suspect prerenal AKI in setting of HTN medications and poor po intake due to n/v. No improvement following IVF and oral fluids. Will obtain FeNa to further assess etiology. Oral fluid intake encouraged. We have reached out to nursing to try to obtain a more accurate urine output measurement.  - F/u with FeNa - Trend BMP/renal function - Monitor strict I&Os - If no improvement with IVF, consider obtaining urine studies to calculate FENa  - Avoid nephrotoxic agents and hold home antihypertensives    #HTN Med list reports lisinopril-hydrochlorothiazide 20-25 mg and atenolol  100 mg daily. He has also had low-normal BP so far in admission and current likely AKI, so will continue to hold  - Hold home antihypertensive in setting of AKI and low-normal BP   #HLD Takes Rosuvastatin  20 mg daily and Gemfibrozil  600 mg twice daily at home. Lipid panel showed low HDL, otherwise unremarkable.  - We will resume his home Rosuvastatin  20 mg daily today  - Hold home gemfibrozil  until better po intake  Best Practice: Diet: Clear liquid diet VTE: enoxaparin  (LOVENOX ) injection 40 mg Start: 02/14/24 1000 Code: Full  Disposition planning: Therapy Recs: Pending, DME: none DISPO: Anticipated discharge tomorrow or Saturday to group home pending symptom improvement and verification of adequate care.  Signature:  Nunzio DELENA Bolt Verma [facility]  9:39 AM, 02/15/2024  On Call pager [phone]   Attestation  for Student Documentation:  I personally was present and re-performed the history, physical exam and medical decision-making activities of this service and have verified that the service and findings are accurately documented in the student's note.  Doyal Miyamoto, MD 02/15/2024, 11:09 AM On Call pager [phone]

## 2024-02-15 NOTE — Care Management Obs Status (Signed)
 MEDICARE OBSERVATION STATUS NOTIFICATION   Patient Details  Name: GUILFORD SHANNAHAN MRN: [id number] Date of Birth: [birth year]   Medicare Observation Status Notification Given:  Yes    Jon Cruel 02/15/2024, 12:31 PM

## 2024-02-15 NOTE — Plan of Care (Signed)

## 2024-02-15 NOTE — Progress Notes (Signed)
 8/14 I spoke with Avelina Crete, Administrator of Group Home telephonically, where patient is staying and received verbal confirmation of MOON Letter. Avelina Crete [phone] guardian

## 2024-02-15 NOTE — Plan of Care (Signed)
  Problem: Activity: Goal: Risk for activity intolerance will decrease Outcome: Progressing   Problem: Nutrition: Goal: Adequate nutrition will be maintained Outcome: Progressing   Problem: Coping: Goal: Level of anxiety will decrease Outcome: Progressing   Problem: Elimination: Goal: Will not experience complications related to bowel motility Outcome: Progressing Goal: Will not experience complications related to urinary retention Outcome: Progressing   Problem: Safety: Goal: Ability to remain free from injury will improve Outcome: Progressing

## 2024-02-16 LAB — CBC
HCT: 29.9 % — ABNORMAL LOW (ref 39.0–52.0)
Hemoglobin: 10.4 g/dL — ABNORMAL LOW (ref 13.0–17.0)
MCH: 30.1 pg (ref 26.0–34.0)
MCHC: 34.8 g/dL (ref 30.0–36.0)
MCV: 86.7 fL (ref 80.0–100.0)
Platelets: 224 K/uL (ref 150–400)
RBC: 3.45 MIL/uL — ABNORMAL LOW (ref 4.22–5.81)
RDW: 13.2 % (ref 11.5–15.5)
WBC: 17.8 K/uL — ABNORMAL HIGH (ref 4.0–10.5)
nRBC: 0 % (ref 0.0–0.2)

## 2024-02-16 LAB — CREATININE, URINE, RANDOM: Creatinine, Urine: 88 mg/dL

## 2024-02-16 LAB — LIPASE, BLOOD: Lipase: 80 U/L — ABNORMAL HIGH (ref 11–51)

## 2024-02-16 LAB — COMPREHENSIVE METABOLIC PANEL WITH GFR
ALT: 45 U/L — ABNORMAL HIGH (ref 0–44)
AST: 71 U/L — ABNORMAL HIGH (ref 15–41)
Albumin: 2.4 g/dL — ABNORMAL LOW (ref 3.5–5.0)
Alkaline Phosphatase: 229 U/L — ABNORMAL HIGH (ref 38–126)
Anion gap: 12 (ref 5–15)
BUN: 37 mg/dL — ABNORMAL HIGH (ref 6–20)
CO2: 21 mmol/L — ABNORMAL LOW (ref 22–32)
Calcium: 8.2 mg/dL — ABNORMAL LOW (ref 8.9–10.3)
Chloride: 104 mmol/L (ref 98–111)
Creatinine, Ser: 1.86 mg/dL — ABNORMAL HIGH (ref 0.61–1.24)
GFR, Estimated: 43 mL/min — ABNORMAL LOW (ref >60)
Glucose, Bld: 137 mg/dL — ABNORMAL HIGH (ref 70–99)
Potassium: 3.1 mmol/L — ABNORMAL LOW (ref 3.5–5.1)
Sodium: 137 mmol/L (ref 135–145)
Total Bilirubin: 0.8 mg/dL (ref 0.0–1.2)
Total Protein: 6.5 g/dL (ref 6.5–8.1)

## 2024-02-16 LAB — SODIUM, URINE, RANDOM: Sodium, Ur: 58 mmol/L

## 2024-02-16 MED ORDER — ONDANSETRON HCL 4 MG/2ML IJ SOLN
4.0000 mg | Freq: Four times a day (QID) | INTRAMUSCULAR | Status: DC
  Administered 2024-02-17 – 2024-02-18 (×3): 4 mg via INTRAVENOUS
  Filled 2024-02-16 (×3): qty 2

## 2024-02-16 MED ORDER — ACETAMINOPHEN 10 MG/ML IV SOLN: 1000.0000 mg | Freq: Three times a day (TID) | INTRAVENOUS | Status: AC

## 2024-02-16 MED ORDER — OXYCODONE HCL 5 MG PO TABS
5.0000 mg | ORAL_TABLET | Freq: Four times a day (QID) | ORAL | Status: DC
  Administered 2024-02-16 – 2024-02-17 (×2): 5 mg via ORAL
  Filled 2024-02-16 (×3): qty 1

## 2024-02-16 MED ORDER — FLUTICASONE PROPIONATE 50 MCG/ACT NA SUSP
1.0000 | Freq: Every day | NASAL | Status: DC
  Administered 2024-02-16 – 2024-02-19 (×4): 1 via NASAL
  Filled 2024-02-16: qty 16

## 2024-02-16 MED ORDER — HYDROMORPHONE HCL 1 MG/ML IJ SOLN
0.5000 mg | Freq: Four times a day (QID) | INTRAMUSCULAR | Status: DC
  Filled 2024-02-16: qty 0.5

## 2024-02-16 MED ORDER — ACETAMINOPHEN 500 MG PO TABS: 1000.0000 mg | ORAL_TABLET | Freq: Three times a day (TID) | ORAL | Status: DC

## 2024-02-16 MED ORDER — POTASSIUM CHLORIDE CRYS ER 20 MEQ PO TBCR
40.0000 meq | EXTENDED_RELEASE_TABLET | Freq: Two times a day (BID) | ORAL | Status: AC
  Administered 2024-02-16 (×2): 40 meq via ORAL
  Filled 2024-02-16 (×2): qty 2

## 2024-02-16 MED ORDER — LACTATED RINGERS IV SOLN: INTRAVENOUS | Status: AC

## 2024-02-16 MED ORDER — HYDROMORPHONE HCL 1 MG/ML IJ SOLN: 0.5000 mg | INTRAMUSCULAR | Status: DC | PRN

## 2024-02-16 MED ORDER — ACETAMINOPHEN 500 MG PO TABS
1000.0000 mg | ORAL_TABLET | Freq: Three times a day (TID) | ORAL | Status: AC
  Administered 2024-02-16 – 2024-02-17 (×3): 1000 mg via ORAL
  Filled 2024-02-16 (×3): qty 2

## 2024-02-16 NOTE — Plan of Care (Signed)
   Problem: Education: Goal: Knowledge of General Education information will improve Description Including pain rating scale, medication(s)/side effects and non-pharmacologic comfort measures Outcome: Progressing   Problem: Health Behavior/Discharge Planning: Goal: Ability to manage health-related needs will improve Outcome: Progressing

## 2024-02-16 NOTE — Progress Notes (Signed)
 Patient's sister, Vertell is at the bedside and confirms that she is an emergency contact for the patient. Vertell states that Hezzie has been at the same group home for 25 years. That Quinn has a great imagination and text her a lot about calling lawyers and the police on the group home staff. Vertell states that the [place] police have asked Nathanial to stop calling 911 after confirming the group home has done no harm.

## 2024-02-16 NOTE — Progress Notes (Signed)
 HD#1 SUBJECTIVE:  Patient Summary: Richard Carpenter is a 53 y.o. person living with a history of HTN and HLD who presented with abdominal pain and admitted for acute pancreatitis, now improving.    Overnight Events: None  Interim History:  Patient reports nausea and vomiting, endorsing at least 2 episodes this morning without blood. He also reports about 5 episodes of diarrhea in the past day, but denies blood or darkness to them. Reports intermittent abdominal pain, rates it 8-9/10. He does note he is having the pain right now. When discussing his safety at the group home, he reports he has yelling at his group home, both around him and at him. He feels unsafe in the sense that he feels like he could fall due to an uneven ground, but he state he does not feel unsafe with any one person. He states he has access to food and water at his group home. He does not think the people make him feel unsafe, but he says they have to do their job to make sure people don't get injured. On physical exam, he reports some abdominal pain with palpation.   OBJECTIVE:  Vital Signs: Vitals:   02/15/24 1006 02/15/24 1540 02/15/24 2039 02/16/24 0505  BP: 115/70 (!) 110/58 101/69 108/76  Pulse: (!) 103 99 96 89  Resp: 17 17  16   Temp: 99.4 F (37.4 C) 98.4 F (36.9 C) 99.5 F (37.5 C) 98.7 F (37.1 C)  TempSrc: Oral Oral Oral Oral  SpO2: 96% 99% 98% 94%  Weight:      Height:       Supplemental O2: Room Air SpO2: 94 %  Filed Weights   02/13/24 1917  Weight: 91.6 kg     Intake/Output Summary (Last 24 hours) at 02/16/2024 9340 Last data filed at 02/15/2024 2039 Gross per 24 hour  Intake 600 ml  Output 700 ml  Net -100 ml   Net IO Since Admission: 2,055.3 mL [02/16/24 0659]  Physical Exam:  Constitutional: well-appearing male sitting in exam chair, in no acute distress HEENT: normocephalic atraumatic, mucous membranes dry Cardiovascular: regular rate and rhythm, no swelling bilateral  extremities  Pulmonary/Chest: normal work of breathing on room air, lungs clear to auscultation bilaterally Abdominal: soft, non-distended, tender to palpation RUQ, LUQ and epigastric region, bowel sounds to auscultation appropriate in all quadrants.  MSK: normal bulk and tone. Neurological: alert & oriented x 3 Skin: warm and dry Psych: mood calm, behavior normal, thought content normal, judgement normal    Patient Lines/Drains/Airways Status     Active Line/Drains/Airways     Name Placement date Placement time Site Days   Peripheral IV 02/13/24 20 G Left Antecubital 02/13/24  1916  Antecubital  3            Pertinent labs and imaging:      Latest Ref Rng & Units 02/15/2024    6:25 AM 02/13/2024    7:05 PM  CBC  WBC 4.0 - 10.5 K/uL 20.5  35.3   Hemoglobin 13.0 - 17.0 g/dL 89.1  86.3   Hematocrit 39.0 - 52.0 % 31.7  39.2   Platelets 150 - 400 K/uL 216  387        Latest Ref Rng & Units 02/15/2024    6:25 AM 02/13/2024    7:05 PM  CMP  Glucose 70 - 99 mg/dL 90  875   BUN 6 - 20 mg/dL 54  46   Creatinine 9.38 - 1.24 mg/dL 7.10  2.49   Sodium 135 - 145 mmol/L 136  137   Potassium 3.5 - 5.1 mmol/L 3.7  4.0   Chloride 98 - 111 mmol/L 104  101   CO2 22 - 32 mmol/L 18  19   Calcium  8.9 - 10.3 mg/dL 7.9  8.9   Total Protein 6.5 - 8.1 g/dL 6.2  8.3   Total Bilirubin 0.0 - 1.2 mg/dL 1.1  1.3   Alkaline Phos 38 - 126 U/L 174  136   AST 15 - 41 U/L 32  33   ALT 0 - 44 U/L 20  20     No results found.  ASSESSMENT/PLAN:  Assessment: Principal Problem:   Acute pancreatitis Active Problems:   Elevated serum creatinine   Plan: ##Acute Pancreatitis  #Nausea and Vomiting  #Transaminitis  #Abdominal Pain Today patient reports symptoms of intermittent 8-9/10 abdominal pain, nausea, 2 episodes of vomiting this morning and more yesterday, and 5 episodes of diarrhea in the past 24 hours. Leukocytosis improved but is still elevated (35.3 to 17.8). BUN (54 to 37), Scr (2.89 to  1.86) and Alk Phos (174 to 229), AST (32 to 71) and ALT (20 to 45) has increased from yesterday. Suspect that he has not been letting nursing know when he is in pain and nauseous, so we will plan to schedule Tylenol  pain medication, Oxycodone  or IV Dilaudid ,  resume IVF and schedule Zofran .   - Pain control:  - Scheduled Tylenol  1000 mg q8 hrs for 24 hrs  - Scheduled Oxycodone  5 mg q6 hrs OR IV Dilaudid  0.5 mg q6 hrs  - Scheduled IV Zofran  4 mg - f/u EKG to monitor QTc - ADAT with improved of n/v - Resume IVF LR at 125 mL/hr for 16 hrs  - CBC - CMP  - Consider general surgery consult with symptom improvement    #Leukocytosis WBC improving from 35.3 on admission to 17.8 today. He has remained afebrile. No respiratory symptoms, lung exam clear and saturating well on RA. Denies urinary symptoms such as dysuria or hematuria.  - Trend CBC - Trend temperature  - If fevers, then consider blood cultures, check CXR and empiric abx    #AKI  Scr improving from 2.89 to 1.86 today. Unclear of baseline given no records so unclear if patient has hx of CKD, though he denies hx of CKD. UA with moderate Hgb, proteinuria and few bacteria - asymptomatic bacteruria. CT without hydronephrosis or calculi. He continues to deny any UTI or obstructive symptoms as above. FENa of 1.4% intermediate indicating prerenal or intrinsic etiology. Suspect prerenal AKI in setting of HTN medications and poor po intake due to n/v. Improvement of his AKI, though due to return of NVD symptoms, will resume IVF as above.  - CMP  - Monitor strict I&Os - Avoid nephrotoxic agents and hold home antihypertensives    #HTN Med list reports lisinopril-hydrochlorothiazide 20-25 mg and atenolol  100 mg daily. He has also had low-normal BP so far in admission and current likely AKI, so will continue to hold  - Hold home antihypertensive in setting of AKI and low-normal BP   #HLD Takes Rosuvastatin  20 mg daily and Gemfibrozil  600 mg twice  daily at home. Lipid panel showed low HDL, otherwise unremarkable. Will hold these until improvement of symptoms and better po intake.  - Hold home Rosuvastatin  20 mg daily in setting of NVD  - Hold home gemfibrozil  until better po intake  Best Practice: Diet: Clear liquid diet, advance as tolerated  VTE: enoxaparin  (LOVENOX ) injection 40 mg Start: 02/14/24 1000 Code: Full  Disposition planning: DISPO: Anticipated discharge 2-3 days to group home pending symptom improvement and AKI.   Signature:  Doyal Miyamoto, MD [facility]  6:59 AM, 02/16/2024  On Call pager [phone]

## 2024-02-17 DIAGNOSIS — Z79899 Other long term (current) drug therapy: Secondary | ICD-10-CM

## 2024-02-17 DIAGNOSIS — K859 Acute pancreatitis without necrosis or infection, unspecified: Secondary | ICD-10-CM

## 2024-02-17 DIAGNOSIS — E785 Hyperlipidemia, unspecified: Secondary | ICD-10-CM

## 2024-02-17 DIAGNOSIS — I10 Essential (primary) hypertension: Secondary | ICD-10-CM

## 2024-02-17 DIAGNOSIS — N179 Acute kidney failure, unspecified: Secondary | ICD-10-CM

## 2024-02-17 LAB — CBC
HCT: 30.7 % — ABNORMAL LOW (ref 39.0–52.0)
Hemoglobin: 10.7 g/dL — ABNORMAL LOW (ref 13.0–17.0)
MCH: 30.1 pg (ref 26.0–34.0)
MCHC: 34.9 g/dL (ref 30.0–36.0)
MCV: 86.2 fL (ref 80.0–100.0)
Platelets: 229 K/uL (ref 150–400)
RBC: 3.56 MIL/uL — ABNORMAL LOW (ref 4.22–5.81)
RDW: 12.9 % (ref 11.5–15.5)
WBC: 18 K/uL — ABNORMAL HIGH (ref 4.0–10.5)
nRBC: 0 % (ref 0.0–0.2)

## 2024-02-17 LAB — COMPREHENSIVE METABOLIC PANEL WITH GFR
ALT: 64 U/L — ABNORMAL HIGH (ref 0–44)
AST: 81 U/L — ABNORMAL HIGH (ref 15–41)
Albumin: 2.3 g/dL — ABNORMAL LOW (ref 3.5–5.0)
Alkaline Phosphatase: 235 U/L — ABNORMAL HIGH (ref 38–126)
Anion gap: 12 (ref 5–15)
BUN: 23 mg/dL — ABNORMAL HIGH (ref 6–20)
CO2: 21 mmol/L — ABNORMAL LOW (ref 22–32)
Calcium: 8.5 mg/dL — ABNORMAL LOW (ref 8.9–10.3)
Chloride: 104 mmol/L (ref 98–111)
Creatinine, Ser: 1.54 mg/dL — ABNORMAL HIGH (ref 0.61–1.24)
GFR, Estimated: 54 mL/min — ABNORMAL LOW (ref >60)
Glucose, Bld: 115 mg/dL — ABNORMAL HIGH (ref 70–99)
Potassium: 3.6 mmol/L (ref 3.5–5.1)
Sodium: 137 mmol/L (ref 135–145)
Total Bilirubin: 0.9 mg/dL (ref 0.0–1.2)
Total Protein: 6.5 g/dL (ref 6.5–8.1)

## 2024-02-17 MED ORDER — OXYCODONE HCL 5 MG PO TABS
5.0000 mg | ORAL_TABLET | Freq: Three times a day (TID) | ORAL | Status: DC
  Administered 2024-02-17 – 2024-02-18 (×3): 5 mg via ORAL
  Filled 2024-02-17 (×3): qty 1

## 2024-02-17 MED ORDER — ACETAMINOPHEN 10 MG/ML IV SOLN: 1000.0000 mg | Freq: Three times a day (TID) | INTRAVENOUS | Status: DC

## 2024-02-17 MED ORDER — ACETAMINOPHEN 500 MG PO TABS
1000.0000 mg | ORAL_TABLET | Freq: Three times a day (TID) | ORAL | Status: AC
  Administered 2024-02-17 – 2024-02-18 (×3): 1000 mg via ORAL
  Filled 2024-02-17 (×3): qty 2

## 2024-02-17 MED ORDER — POLYETHYLENE GLYCOL 3350 17 G PO PACK: 17.0000 g | PACK | Freq: Every day | ORAL | Status: DC | PRN

## 2024-02-17 MED ORDER — LACTATED RINGERS IV SOLN: INTRAVENOUS | Status: AC

## 2024-02-17 MED ORDER — SENNOSIDES-DOCUSATE SODIUM 8.6-50 MG PO TABS: 1.0000 | ORAL_TABLET | Freq: Every evening | ORAL | Status: DC | PRN

## 2024-02-17 MED ORDER — POLYETHYLENE GLYCOL 3350 17 G PO PACK: 17.0000 g | PACK | Freq: Every day | ORAL | Status: DC

## 2024-02-17 NOTE — Progress Notes (Addendum)
 HD#2 SUBJECTIVE:  Patient Summary: Richard Carpenter is a 53 y.o. man with a history of HTN and HLD who presented to [facility] hospital on 8/13 with abdominal pain and admitted for acute pancreatitis, now improving.    Overnight Events and Interim History: NEO. Patient state his pain is gone today. He is not nauseous or vomited today. No BM since his diarrhea prior to admission. Has not had breakfast this morning yet but would like to eat. Tender with deep palpation. Seems distended, but no fluid or air to percussion. Positive bowel sounds. Able to lean forward on exam for bilateral LL sounds.  OBJECTIVE:  Vital Signs: Vitals:   02/16/24 1605 02/16/24 2124 02/17/24 0320 02/17/24 0824  BP: 119/69 112/66 111/70 127/83  Pulse: 76 85 81 85  Resp: 16 16 16 18   Temp: 97.9 F (36.6 C) 98 F (36.7 C) 98.2 F (36.8 C) 98.1 F (36.7 C)  TempSrc: Oral  Oral Oral  SpO2: 98% 97% 97% 95%  Weight:      Height:       Supplemental O2: Room Air SpO2: 95 %  Filed Weights   02/13/24 1917  Weight: 91.6 kg     Intake/Output Summary (Last 24 hours) at 02/17/2024 1045 Last data filed at 02/16/2024 1800 Gross per 24 hour  Intake 962.67 ml  Output --  Net 962.67 ml   Net IO Since Admission: 3,104.71 mL [02/17/24 1045]  Physical Exam: Physical Exam Constitutional:      General: He is not in acute distress.    Appearance: He is well-developed. He is not ill-appearing.  Cardiovascular:     Rate and Rhythm: Normal rate and regular rhythm.     Heart sounds: Normal heart sounds.  Pulmonary:     Effort: Pulmonary effort is normal.     Breath sounds: Normal breath sounds.  Abdominal:     General: Abdomen is flat. Bowel sounds are decreased. There is distension.     Palpations: Abdomen is soft.     Tenderness: There is abdominal tenderness. There is no guarding or rebound.     Comments: Mildly TTP periumbilical and epigastric  Skin:    General: Skin is warm and dry.  Neurological:     General: No  focal deficit present.     Mental Status: He is alert and oriented to person, place, and time.    Patient Lines/Drains/Airways Status     Active Line/Drains/Airways     Name Placement date Placement time Site Days   Peripheral IV 02/13/24 20 G Left Antecubital 02/13/24  1916  Antecubital  4            ASSESSMENT/PLAN:  Assessment: Principal Problem:   Acute pancreatitis Active Problems:   Elevated serum creatinine  Richard Carpenter is a 53 y.o. man with a history of HTN and HLD who presented to [facility] hospital on 8/13 with abdominal pain and admitted for acute pancreatitis, now improving.    Plan: ##Acute Pancreatitis  #Nausea and Vomiting  #Transaminitis  #Abdominal Pain Symptoms fully resolved - no pain, N/V/D, and only mild tenderness/distension on exam. Afebrile. Leukocytosis stable. Scheduled medicine seems to have worked very well for him. Will advance diet and provide fluids.\ - Advance diet to regular - Pain control: Will keep schedule but scale back - Scheduled Tylenol  1000 mg q8 hrs - Scheduled Oxycodone  5 mg q8 hrs - Can give breakthrough IV dilaudid  if needed - Scheduled IV Zofran  4 mg (EKG with appropriate Qtc) -  Recheck LFTs in am - IVF LR at 125 mL/hr for 8 hrs, may be able to end this as diet returns - Consider general surgery consult if severe worsening    #Leukocytosis Likely due to above. No apparent source of infection. WBCs stable 18. He has remained afebrile. No respiratory symptoms, lung exam clear and saturating well on RA. Denies urinary symptoms such as dysuria or hematuria.  - Trend CBCs and fevers - If fevers, then consider blood cultures, check CXR and empiric abx    #AKI  Continues to improve. Baseline is unclear. No known hx CKD. UA asymptomatic bacteruria. CT of kidneys negative. FENa indetermiante range. With his other issues, prerenal favored and he is responding to fluids. - IV fluids as above then oral rehydration - Monitor strict  I&Os - Avoid nephrotoxic agents and hold home antihypertensives    #HTN Med list reports lisinopril-hydrochlorothiazide 20-25 mg and atenolol  100 mg daily. Pressures are low-normal and with AKI, will continue to hold    #HLD Takes Rosuvastatin  20 mg daily and Gemfibrozil  600 mg twice daily at home. Lipid panel showed low HDL, otherwise unremarkable. Will hold these until improvement of symptoms and better po intake.   Best Practice: Diet: Advance to regular as tolearted IVF: Fluids: LR, Rate: 125/h x 8h VTE: enoxaparin  (LOVENOX ) injection 40 mg Start: 02/14/24 1000 Code: Full AB: None Pain Medicine: Scheduled, see above Bowel Regimen: Miralax , senokot Therapy Recs: n/a DISPO: Anticipated discharge 1-2 to group home pending Diet tolerance, pain control, AKI.  Signature: Lonni Africa, D.O.  Internal Medicine Resident, PGY-2 [facility]  Pager: # [phone]. 10:45 AM, 02/17/2024

## 2024-02-18 LAB — COMPREHENSIVE METABOLIC PANEL WITH GFR
ALT: 90 U/L — ABNORMAL HIGH (ref 0–44)
AST: 93 U/L — ABNORMAL HIGH (ref 15–41)
Albumin: 2.4 g/dL — ABNORMAL LOW (ref 3.5–5.0)
Alkaline Phosphatase: 277 U/L — ABNORMAL HIGH (ref 38–126)
Anion gap: 13 (ref 5–15)
BUN: 22 mg/dL — ABNORMAL HIGH (ref 6–20)
CO2: 22 mmol/L (ref 22–32)
Calcium: 8.8 mg/dL — ABNORMAL LOW (ref 8.9–10.3)
Chloride: 104 mmol/L (ref 98–111)
Creatinine, Ser: 1.42 mg/dL — ABNORMAL HIGH (ref 0.61–1.24)
GFR, Estimated: 60 mL/min — ABNORMAL LOW (ref >60)
Glucose, Bld: 136 mg/dL — ABNORMAL HIGH (ref 70–99)
Potassium: 3.5 mmol/L (ref 3.5–5.1)
Sodium: 139 mmol/L (ref 135–145)
Total Bilirubin: 0.7 mg/dL (ref 0.0–1.2)
Total Protein: 6.6 g/dL (ref 6.5–8.1)

## 2024-02-18 LAB — CBC
HCT: 33.3 % — ABNORMAL LOW (ref 39.0–52.0)
Hemoglobin: 11.4 g/dL — ABNORMAL LOW (ref 13.0–17.0)
MCH: 29.3 pg (ref 26.0–34.0)
MCHC: 34.2 g/dL (ref 30.0–36.0)
MCV: 85.6 fL (ref 80.0–100.0)
Platelets: 279 K/uL (ref 150–400)
RBC: 3.89 MIL/uL — ABNORMAL LOW (ref 4.22–5.81)
RDW: 12.8 % (ref 11.5–15.5)
WBC: 18.6 K/uL — ABNORMAL HIGH (ref 4.0–10.5)
nRBC: 0 % (ref 0.0–0.2)

## 2024-02-18 MED ORDER — ACETAMINOPHEN 325 MG PO TABS: 650.0000 mg | ORAL_TABLET | Freq: Four times a day (QID) | ORAL | Status: DC | PRN

## 2024-02-18 MED ORDER — ATENOLOL 25 MG PO TABS
100.0000 mg | ORAL_TABLET | Freq: Every day | ORAL | Status: DC
  Administered 2024-02-18 – 2024-02-19 (×2): 100 mg via ORAL
  Filled 2024-02-18 (×2): qty 4

## 2024-02-18 MED ORDER — ONDANSETRON HCL 4 MG PO TABS
4.0000 mg | ORAL_TABLET | Freq: Three times a day (TID) | ORAL | Status: DC | PRN
  Administered 2024-02-18: 4 mg via ORAL
  Filled 2024-02-18: qty 1

## 2024-02-18 NOTE — Progress Notes (Signed)
 Anticipated discharge for this patient after 36 hours of successful diet advance, no pain/nausea. This afternoon he experienced recurrent emesis x 3, nonbloody, and an episode of diarrhea. On assessment, well overall. Feels well without pain or ongoing nausea. No epigastric pain. Ate solid breakfast this am. Will delay discharge by one evening and reassess tomorrow. Keep full diet but cautioned him to go slow. Zofran  and tylenol  remain available.   Lonni Africa, DO

## 2024-02-18 NOTE — Progress Notes (Addendum)
 HD#3 SUBJECTIVE:  Patient Summary: Richard Carpenter is a 53 y.o. man with a history of HTN and HLD who presented to [facility] hospital on 8/13 with abdominal pain and admitted for acute pancreatitis, symptoms now resolved.    Overnight Events: None  Interim History: Richard Carpenter reports feeling much better this morning with resolution of his pain and GI symptoms. Richard Carpenter denies fever, nausea, vomiting, diarrhea or abdominal pain. Richard Carpenter reports no issues with food or fluid intake, and feels prepared to be discharged back to his group home without any concerns about this.  OBJECTIVE:  Vital Signs: Vitals:   02/17/24 1544 02/17/24 2308 02/18/24 0439 02/18/24 0751  BP: 112/67 133/81 127/84 126/82  Pulse: 82 100 79 (!) 110  Resp: 18 17 18 16   Temp: 98.3 F (36.8 C) 99.8 F (37.7 C) 97.7 F (36.5 C) 98.7 F (37.1 C)  TempSrc: Oral Oral Oral Oral  SpO2: 98% 94% 97% 97%  Weight:      Height:       Supplemental O2: Room Air SpO2: 97 %  Filed Weights   02/13/24 1917  Weight: 91.6 kg     Intake/Output Summary (Last 24 hours) at 02/18/2024 0928 Last data filed at 02/17/2024 2140 Gross per 24 hour  Intake 480 ml  Output --  Net 480 ml   Net IO Since Admission: 4,064.71 mL [02/18/24 0928]  Physical Exam: Physical Exam Vitals reviewed.  Cardiovascular:     Rate and Rhythm: Normal rate and regular rhythm.     Heart sounds: Normal heart sounds.  Pulmonary:     Effort: Pulmonary effort is normal.     Breath sounds: Normal breath sounds.  Abdominal:     General: Bowel sounds are normal. There is no distension.     Palpations: Abdomen is soft.     Tenderness: There is no abdominal tenderness.  Neurological:     Mental Status: Richard Carpenter is alert.     Patient Lines/Drains/Airways Status     Active Line/Drains/Airways     Name Placement date Placement time Site Days   Peripheral IV 02/13/24 20 G Left Antecubital 02/13/24  1916  Antecubital  5            Pertinent labs and imaging:       Latest Ref Rng & Units 02/18/2024    7:29 AM 02/17/2024    7:37 AM 02/16/2024    6:59 AM  CBC  WBC 4.0 - 10.5 K/uL 18.6  18.0  17.8   Hemoglobin 13.0 - 17.0 g/dL 88.5  89.2  89.5   Hematocrit 39.0 - 52.0 % 33.3  30.7  29.9   Platelets 150 - 400 K/uL 279  229  224        Latest Ref Rng & Units 02/18/2024    7:29 AM 02/17/2024    7:37 AM 02/16/2024    6:59 AM  CMP  Glucose 70 - 99 mg/dL 863  884  862   BUN 6 - 20 mg/dL 22  23  37   Creatinine 0.61 - 1.24 mg/dL 8.57  8.45  8.13   Sodium 135 - 145 mmol/L 139  137  137   Potassium 3.5 - 5.1 mmol/L 3.5  3.6  3.1   Chloride 98 - 111 mmol/L 104  104  104   CO2 22 - 32 mmol/L 22  21  21    Calcium  8.9 - 10.3 mg/dL 8.8  8.5  8.2   Total Protein 6.5 - 8.1 g/dL  6.6  6.5  6.5   Total Bilirubin 0.0 - 1.2 mg/dL 0.7  0.9  0.8   Alkaline Phos 38 - 126 U/L 277  235  229   AST 15 - 41 U/L 93  81  71   ALT 0 - 44 U/L 90  64  45     No results found.  ASSESSMENT/PLAN:  Assessment: Principal Problem:   Acute pancreatitis Active Problems:   Elevated serum creatinine   Plan: ##Acute Pancreatitis  #Nausea and Vomiting  #Transaminitis  #Abdominal Pain Leukocytosis is stable. LFTs have increased slightly from yesterday, but no jaundice, hyperuremia, pruritus, RUQ pain, or hyperbilirubinemia, reassuring against acute cholestasis. Lab abnormalities likely due to acute pancreatitis from biliary cause and may take some time to normalize. Symptoms fully resolved - no pain, N/V/D, and no abdominal tenderness/distension on exam. Afebrile. Oral intake of fluids and normal diet adequate and well tolerated. Richard Carpenter will be transitioned to oral PRN analgesics and antiemetics in anticipation of discharge. Recommend outpatient PCP and general surgery follow up. - PO Tylenol  650 mg q6h PRN - PO Zofran  4 mg q8h PRN (EKG with appropriate Qtc) - Continue to monitor CBC and LFTs  - Follow up with outpatient PCP - Consider outpatient gen surg f/u for possible  cholecystectomy   #Leukocytosis Likely due to above. No apparent source of infection. WBCs stable 18. Richard Carpenter has remained afebrile and HDS throughout admission. No respiratory symptoms, lung exam clear and saturating well on RA. Denies urinary symptoms such as dysuria or hematuria.  - Trend CBCs and fevers - If fevers, then consider blood cultures, check CXR and empiric abx    #AKI  Continues to improve, Cr today of 1.42. Baseline is unclear. No known hx CKD. UA asymptomatic bacteruria. CT of kidneys negative. FENa indetermiante range. With his other issues, prerenal favored and Richard Carpenter is responding to fluids. Will continue to hold home Lisinopril-hydrochlorothiazide given AKI and low-normal BP.  - Oral rehydration - Monitor strict I&Os - Avoid nephrotoxic agents and hold home Lisinopril-HCTZ    #HTN Med list reports lisinopril-hydrochlorothiazide 20-25 mg daily and atenolol  100 mg daily. Blood pressures are low-normal and with AKI, so will continue to hold lisinopril-hydrochlorothiazide. Richard Carpenter has had some mild tachycardia to 110, so will add home atenolol  back today. Anticipate that we will instruct him to hold Lisinopril-hydrochlorothiazide medication on discharge until Richard Carpenter follows up outpatient with his PCP.  - Continue holding lisinopril-hydrochlorothiazide as above - Resume home Atenolol  100 mg daily    #HLD Takes Rosuvastatin  20 mg daily and Gemfibrozil  600 mg twice daily at home. Lipid panel showed low HDL, otherwise unremarkable. His symptoms have resolved and Richard Carpenter is tolerating diet and fluid intake well. Will consider restarting home rosuvastatin  and gemfibrozil  pending improvement of renal and liver function. - CMP pending - Continue holding lipid medications as above  Best Practice: Diet: Regular diet VTE: enoxaparin  (LOVENOX ) injection 40 mg Start: 02/14/24 1000 Code: Full  Disposition planning: Therapy Recs: None, DME: none DISPO: Anticipated discharge today to [facility] pending  dispo.  Signature:  Nunzio DELENA Bolt [facility]  9:28 AM, 02/18/2024  On Call pager [phone]   Attestation for Student Documentation:  I personally was present and re-performed the history, physical exam and medical decision-making activities of this service and have verified that the service and findings are accurately documented in the student's note.  Doyal Miyamoto, MD  02/18/2024, 9:35 AM

## 2024-02-18 NOTE — Progress Notes (Signed)
 Dr. Leontine notified that patient had 4 episodes of vomiting that occurred prior to him eating lunch.

## 2024-02-18 NOTE — Discharge Summary (Signed)
 Name: Richard Carpenter MRN: [id number] DOB: 05-11-1971 53 y.o. PCP: Loring Tanda Mae, MD  Date of Admission: 02/13/2024  6:47 PM Date of Discharge: 02/19/2024 Attending Physician: Dr. MICAEL Riis Winfrey  Discharge Diagnosis: 1. Principal Problem:   Acute pancreatitis Active Problems:   Elevated serum creatinine   Discharge Medications: Allergies as of 02/19/2024   No Known Allergies      Medication List     PAUSE taking these medications    gemfibrozil  600 MG tablet Wait to take this until your doctor or other care provider tells you to start again. Commonly known as: LOPID  Take 600 mg by mouth 2 (two) times daily before a meal.   lisinopril-hydrochlorothiazide 20-25 MG tablet Wait to take this until your doctor or other care provider tells you to start again. Commonly known as: ZESTORETIC Take 1 tablet by mouth daily.   rosuvastatin  20 MG tablet Wait to take this until your doctor or other care provider tells you to start again. Commonly known as: CRESTOR  Take 20 mg by mouth 2 (two) times daily.       STOP taking these medications    atorvastatin 40 MG tablet Commonly known as: LIPITOR       TAKE these medications    atenolol  100 MG tablet Commonly known as: TENORMIN  Take 100 mg by mouth daily.        Disposition and follow-up:   Richard Carpenter was discharged from [facility] in Good condition.  At the hospital follow up visit please address:  [  ] 1.  Patient's home BP medications were held during admission due to AKI and low-normal BP readings. He was restarted on home Atenolol  100 mg daily on 8/17, though continued to hold Lisinopril-hydrochlorothiazide due to AKI and low-normal BP. Follow up outpatient with PCP to determine if appropriate to resume.   - Continue home Atenolol  100 mg daily  - Hold home Lisinopril-hydrochlorothiazide until PCP follow up  [  ] 2.  Patient's home lipid lowering medications were held  during admission. He was advised to hold home Rosuvastatin  20 mg daily and home Gemfibrozil  600 mg on discharge until follow up with PCP and improvement of LFTs.   - Hold home Rosuvastatin  and Gemfibrozil  until PCP follow up  [  ] 3.  Unclear exact etiology of this acute pancreatitis episode. He does have cholelithiasis, and should consider outpatient follow up with General Surgery for possible cholecystectomy. No urgent need for this while inpatient.   - Referral to General Surgery outpatient for possible cholecystectomy   4.  Labs / imaging needed at time of follow-up:  1) CMP to assess liver enzymes;  2) CBC to assess resolution of leukocytosis (appropriately downtrending on discharge) and normalization of Hgb (appropriately uptrending on discharge)  5.  Pending labs/ test needing follow-up: None  Follow-up Appointments:  Follow-up Information     Loring Tanda Mae, MD. Schedule an appointment as soon as possible for a visit in 1 week(s).   Specialty: Family Medicine Why: Make an appointment with your PCP to be seen within about 1 week after discharge from the hospital. Contact information: [address] [place] [address] [phone]                  Hospital Course by problem list: Richard Carpenter is a 53 y.o. person living with a history of HTN, HLD, and developmental delay who presented with abdominal pain and admitted for acute pancreatitis, now  being discharged on hospital day 4 with the following pertinent hospital course:  ##Acute pancreatitis  #Nausea and vomiting  #Elevated LFTs Patient presented after 2 days of nausea, vomiting, and diarrhea. Vitals on presentation in the ED significant for BP 90/60, with labs showing leukocytosis to 35.3, lipase 120, Scr 2.49. CTAP showed acute interstitial/edematous pancreatitis without pancreatic necrosis and loculated collection with noted cholelithiasis. He received IVF, antiemetics and analgesics. Symptoms  improved throughout admission, with resolution of nausea, no further episodes of emesis, and reduction of pain. Leukocytosis improved from 35.3 to 18.6 by day of discharge. ALP and Tbili were borderline elevated on admission. AST and ALT were elevated on 8/15. Tbili normalized, but ALP, AST and ALT continued to increase slowly, were still mildly elevated (~2x upper normal limit) at discharge. His rosuvastatin  was held in the setting of transaminitis. His diet was slowly advanced and he was able to tolerate a regular diet by day of discharge. He had some mild diarrhea which improved with loperamide .  - Follow up with outpatient PCP for leukocytosis and transaminitis and Consider general surgery outpatient referral - Consider restarting statin with resolution of elevated LFTs   #Leukocytosis WBC on admission was 35.3 and improved to 18.6 by day of discharge. He has remained afebrile throughout admission. No respiratory symptoms, lung exam clear and saturated well on RA. Denied urinary symptoms such as dysuria or hematuria throughout admission.   - Follow up with outpatient PCP for leukocytosis.   #AKI, resolved  Scr decreased from 2.49 to 1.42. Unclear of baseline given no records so unclear if patient has hx of CKD. Patient denied hx of CKD. Admission BUN creatinine ratio of 18. UA with moderate Hgb, proteinuria and few bacteria - asymptomatic bacteruria. CT without hydronephrosis or calculi. He continues to deny any UTI or obstructive symptoms as above. FENa of 1.4% intermediate indicating prerenal or intrinsic etiology. Suspect prerenal AKI in setting of HTN medications and poor po intake due to n/v, as well as improvement of Scr with fluid administration. He received IVF and then when able, oral fluid intake was encouraged. Nephrotoxic agents and home anti-hypertensive medications were held. By day of discharge, AKI resolved with Cr of 1.16.    #HTN Med list reports lisinopril-hydrochlorothiazide 20-25  mg and atenolol  100 mg daily. He has also had low-normal BP during admission and likely AKI, so held home antihypertensives in setting of AKI and low-normal BP. He was restarted on Atenolol  100 mg daily by 8/17. By day of discharge he continued to have low-normal BP around 101/61, so advised him not to take lisinopril-hydrochlorothiazide until follow up with PCP.  - Follow up with outpatient PCP for reassessment of HTN and restarting medications if appropriate   #HLD Takes Rosuvastatin  20 mg daily and Gemfibrozil  600 mg twice daily at home. Lipid panel showed low HDL, otherwise unremarkable. LFTs elevated as above. Held home lipid lowering drugs as above and restart once transaminitis and symptoms improve.  - Follow up with outpatient PCP for transaminitis     Subjective Patient ate this morning and has not felt nauseous. Did east some dinner last night-- mostly light salad. Feels well today and was able to keep down dinner and breakfast. States he is still having loose stools that is watery at 0530 and 845AM this morning. Has about 2-3x a day. His upper abdominal pain is the improved bilaterally on physical exam.  Discharge Exam:   BP 119/76   Pulse 91   Temp 98.5 F (36.9 C) (  Oral)   Resp 17   Ht 5' 7 (1.702 m)   Wt 91.6 kg   SpO2 93%   BMI 31.64 kg/m   Physical Exam:   Constitutional: well-appearing male sitting in hospital bed, in no acute distress. Ambulates without use of assistance device HEENT: normocephalic atraumatic, mucous membranes moist Cardiovascular: regular rate and rhythm, bilateral radial pulses 2+, bilateral dorsal pedal pulses 2+, brisk capillary refill bilateral feet and hands  Pulmonary/Chest: normal work of breathing on room air, lungs clear to auscultation bilaterally Abdominal: soft, mildly tender to palpation RUQ and LUQ, non-distended; bowel sounds present in all quadrants to auscultation MSK: normal bulk and tone. Neurological: alert & oriented x 3 Skin:  warm and dry Psych: mood calm, behavior normal, thought content normal, judgement normal    Pertinent Labs, Studies, and Procedures:     Latest Ref Rng & Units 02/19/2024    4:39 AM 02/18/2024    7:29 AM 02/17/2024    7:37 AM  CBC  WBC 4.0 - 10.5 K/uL 17.7  18.6  18.0   Hemoglobin 13.0 - 17.0 g/dL 89.5  88.5  89.2   Hematocrit 39.0 - 52.0 % 30.5  33.3  30.7   Platelets 150 - 400 K/uL 254  279  229        Latest Ref Rng & Units 02/19/2024    4:39 AM 02/18/2024    7:29 AM 02/17/2024    7:37 AM  CMP  Glucose 70 - 99 mg/dL 892  863  884   BUN 6 - 20 mg/dL 21  22  23    Creatinine 0.61 - 1.24 mg/dL 8.83  8.57  8.45   Sodium 135 - 145 mmol/L 138  139  137   Potassium 3.5 - 5.1 mmol/L 3.7  3.5  3.6   Chloride 98 - 111 mmol/L 104  104  104   CO2 22 - 32 mmol/L 23  22  21    Calcium  8.9 - 10.3 mg/dL 8.6  8.8  8.5   Total Protein 6.5 - 8.1 g/dL 6.1  6.6  6.5   Total Bilirubin 0.0 - 1.2 mg/dL 0.8  0.7  0.9   Alkaline Phos 38 - 126 U/L 271  277  235   AST 15 - 41 U/L 69  93  81   ALT 0 - 44 U/L 80  90  64     CT ABDOMEN PELVIS W CONTRAST Result Date: 02/13/2024 CLINICAL DATA:  Acute nonlocalized abdominal pain EXAM: CT ABDOMEN AND PELVIS WITH CONTRAST TECHNIQUE: Multidetector CT imaging of the abdomen and pelvis was performed using the standard protocol following bolus administration of intravenous contrast. RADIATION DOSE REDUCTION: This exam was performed according to the departmental dose-optimization program which includes automated exposure control, adjustment of the mA and/or kV according to patient size and/or use of iterative reconstruction technique. CONTRAST:  75mL OMNIPAQUE  IOHEXOL  350 MG/ML SOLN COMPARISON:  None Available. FINDINGS: Lower chest: No acute abnormality. Moderate coronary artery calcification Hepatobiliary: Cholelithiasis without superimposed pericholecystic inflammatory change. Liver unremarkable; no enhancing intrahepatic mass identified. No intra or extrahepatic  biliary ductal dilation. Pancreas: There is extensive peripancreatic inflammatory stranding and edema, asymmetric surrounding the body, head, and uncinate process of the pancreas with acute inflammatory peripancreatic fluid seen tracking inferiorly to surround the third portion of the duodenum. Findings are in keeping with acute interstitial/edematous pancreatitis. No loculated fluid collections are seen. There is normal enhancement of the pancreatic parenchyma. The pancreatic duct is not dilated. Spleen: Unremarkable Adrenals/Urinary  Tract: Adrenal glands are unremarkable. Kidneys are normal, without renal calculi, focal lesion, or hydronephrosis. Bladder is unremarkable. Stomach/Bowel: Mild sigmoid diverticulosis. Circumferential bowel wall thickening and edema involving the second portion of the duodenum related to the adjacent inflammatory process within pancreas. Stomach, small bowel, and large bowel are otherwise unremarkable. Appendix normal. No evidence of obstruction or focal inflammation. No free intraperitoneal gas or fluid. Vascular/Lymphatic: No significant vascular findings are present. No enlarged abdominal or pelvic lymph nodes. Reproductive: Prostate is unremarkable. Other: No abdominal wall hernia or abnormality. No abdominopelvic ascites. Musculoskeletal: No acute or significant osseous findings. IMPRESSION: 1. Acute interstitial/edematous pancreatitis. No evidence of pancreatic necrosis or loculated peripancreatic fluid collection. 2. Cholelithiasis. 3. Mild sigmoid diverticulosis. 4. Moderate coronary artery calcification. Electronically Signed   By: Dorethia Molt M.D.   On: 02/13/2024 22:13     Discharge Instructions: Discharge Instructions     Diet - low sodium heart healthy   Complete by: As directed    Increase activity slowly   Complete by: As directed          Discharge Instructions      To Richard Carpenter or their caretakers,  They were admitted to [facility] on 02/13/2024 for evaluation and treatment of: nausea, vomiting, diarrhea, and abdominal pain     The evaluation suggested acute pancreatitis, possibly due to gallstones. They were treated with IV fluids, pain control, anti-nausea medications and close monitoring.  They were discharged from the hospital on 02/19/24. I recommend the following after leaving the hospital:   Medications You Should Take After Leaving the Hospital:  1) TAKE Atenolol  100 mg by mouth daily   Medications You Should Ask your PCP About Taking After Leaving the Hospital:   1) DO NOT TAKE Lisinopril-hydrochlorothiazide until you see your PCP for further instructions   2) DO NOT TAKE Rosuvastatin  20 mg by mouth daily until you see your PCP for further instructions   3) DO NOT TAKE Gemfibrozil  600 mg by mouth daily until you see your PCP for further instructions    Please schedule PCP follow up with Dr. Tanda Eke about 1 week after discharge from the hospital.    Follow-up Information     Eke Tanda Mae, MD. Schedule an appointment as soon as possible for a visit in 1 week(s).   Specialty: Family Medicine Why: Make an appointment with your PCP to be seen within about 1 week after discharge from the hospital. Contact information: [address] [place] [address] [phone]                  For questions about your care plan, until you are able to see your primary doctor: Call [phone]. Dial 0 for the operator. Ask for the internal medicine resident on call.  Thank you for allowing us  to be part of your care. We are so happy that you are feeling better.   Doyal Miyamoto, MD 02/19/2024, 1:56 PM       Signed: Doyal Miyamoto, MD 02/19/2024, 2:00 PM

## 2024-02-18 NOTE — Discharge Instructions (Addendum)
 To Mr. Richard Carpenter or their caretakers,  They were admitted to [facility] on 02/13/2024 for evaluation and treatment of: nausea, vomiting, diarrhea, and abdominal pain     The evaluation suggested acute pancreatitis, possibly due to gallstones. They were treated with IV fluids, pain control, anti-nausea medications and close monitoring.  They were discharged from the hospital on 02/19/24. I recommend the following after leaving the hospital:   Medications You Should Take After Leaving the Hospital:  1) TAKE Atenolol  100 mg by mouth daily   Medications You Should Ask your PCP About Taking After Leaving the Hospital:   1) DO NOT TAKE Lisinopril-hydrochlorothiazide until you see your PCP for further instructions   2) DO NOT TAKE Rosuvastatin  20 mg by mouth daily until you see your PCP for further instructions   3) DO NOT TAKE Gemfibrozil  600 mg by mouth daily until you see your PCP for further instructions    Please schedule PCP follow up with Dr. Tanda Eke about 1 week after discharge from the hospital.    Follow-up Information     Eke Tanda Mae, MD. Schedule an appointment as soon as possible for a visit in 1 week(s).   Specialty: Family Medicine Why: Make an appointment with your PCP to be seen within about 1 week after discharge from the hospital. Contact information: [address] [place] [address] [phone]                  For questions about your care plan, until you are able to see your primary doctor: Call [phone]. Dial 0 for the operator. Ask for the internal medicine resident on call.  Thank you for allowing us  to be part of your care. We are so happy that you are feeling better.   Doyal Miyamoto, MD 02/19/2024, 1:56 PM

## 2024-02-19 DIAGNOSIS — K8020 Calculus of gallbladder without cholecystitis without obstruction: Secondary | ICD-10-CM

## 2024-02-19 LAB — COMPREHENSIVE METABOLIC PANEL WITH GFR
ALT: 80 U/L — ABNORMAL HIGH (ref 0–44)
AST: 69 U/L — ABNORMAL HIGH (ref 15–41)
Albumin: 2.1 g/dL — ABNORMAL LOW (ref 3.5–5.0)
Alkaline Phosphatase: 271 U/L — ABNORMAL HIGH (ref 38–126)
Anion gap: 11 (ref 5–15)
BUN: 21 mg/dL — ABNORMAL HIGH (ref 6–20)
CO2: 23 mmol/L (ref 22–32)
Calcium: 8.6 mg/dL — ABNORMAL LOW (ref 8.9–10.3)
Chloride: 104 mmol/L (ref 98–111)
Creatinine, Ser: 1.16 mg/dL (ref 0.61–1.24)
GFR, Estimated: >60 mL/min (ref >60)
Glucose, Bld: 107 mg/dL — ABNORMAL HIGH (ref 70–99)
Potassium: 3.7 mmol/L (ref 3.5–5.1)
Sodium: 138 mmol/L (ref 135–145)
Total Bilirubin: 0.8 mg/dL (ref 0.0–1.2)
Total Protein: 6.1 g/dL — ABNORMAL LOW (ref 6.5–8.1)

## 2024-02-19 LAB — CBC
HCT: 30.5 % — ABNORMAL LOW (ref 39.0–52.0)
Hemoglobin: 10.4 g/dL — ABNORMAL LOW (ref 13.0–17.0)
MCH: 29.6 pg (ref 26.0–34.0)
MCHC: 34.1 g/dL (ref 30.0–36.0)
MCV: 86.9 fL (ref 80.0–100.0)
Platelets: 254 K/uL (ref 150–400)
RBC: 3.51 MIL/uL — ABNORMAL LOW (ref 4.22–5.81)
RDW: 13.1 % (ref 11.5–15.5)
WBC: 17.7 K/uL — ABNORMAL HIGH (ref 4.0–10.5)
nRBC: 0 % (ref 0.0–0.2)

## 2024-02-19 MED ORDER — LOPERAMIDE HCL 2 MG PO CAPS
2.0000 mg | ORAL_CAPSULE | Freq: Two times a day (BID) | ORAL | Status: DC
  Administered 2024-02-19: 2 mg via ORAL
  Filled 2024-02-19: qty 1

## 2024-02-19 NOTE — Care Management Important Message (Signed)
 Important Message  Patient Details  Name: Richard Carpenter MRN: [id number] Date of Birth: [birth year]   Important Message Given:  Yes - Medicare IM     Jon Cruel 02/19/2024, 1:41 PM

## 2024-02-19 NOTE — Progress Notes (Signed)
 Discharge instructions given to pt and group home manager Avelina. Both verbalized understanding of all teaching and had no further questions. Pt discharged to group home with manger patricia via wheelchair by volunteer staff with all belongings and paperwork

## 2024-03-29 ENCOUNTER — Ambulatory Visit: Payer: Self-pay | Admitting: Surgery

## 2024-03-29 NOTE — H&P (Signed)
 Subjective    Chief Complaint: New Consultation (cholelithiasis)       History of Present Illness: Richard Carpenter is a 53 y.o. male who is seen today as an office consultation at the request of Dr. Loring for evaluation of New Consultation (cholelithiasis) .     This is a 53 year old male with cerebral palsy who resides in a group home who presents after recent hospitalization.  He is accompanied today by the manager of the group home who is his legal caregiver.   The patient was admitted to the hospital on 02/14/2024 with 2 days of acute epigastric pain associated with nausea, vomiting, diarrhea.  He was diagnosed with mild pancreatitis.  CT scan revealed cholelithiasis but no sign of acute cholecystitis.  He did have edema around his pancreas.  His total bilirubin was mildly elevated 1.3 but quickly normalized.  He is now doing well with no recurrent symptoms.  He has been referred to us  to discuss elective cholecystectomy to prevent a recurrence of his symptoms and pancreatitis.  He reports that his bowel movements have returned to normal.     Review of Systems: A complete review of systems was obtained from the patient.  I have reviewed this information and discussed as appropriate with the patient.  See HPI as well for other ROS.   Review of Systems  Constitutional: Negative.   HENT: Negative.    Eyes: Negative.   Respiratory: Negative.    Cardiovascular: Negative.   Gastrointestinal:  Positive for abdominal pain, blood in stool, diarrhea, nausea and vomiting.  Genitourinary: Negative.   Musculoskeletal: Negative.   Skin: Negative.   Neurological: Negative.   Endo/Heme/Allergies: Negative.   Psychiatric/Behavioral: Negative.          Medical History: Past Medical History      Past Medical History:  Diagnosis Date   Hyperlipidemia     Hypertension          Problem List     Patient Active Problem List  Diagnosis   Acute pancreatitis (HHS-HCC)   Elevated serum  creatinine   Sensorineural hearing loss, bilateral        Past Surgical History  History reviewed. No pertinent surgical history.      Allergies  No Known Allergies     Medications Ordered Prior to Encounter        Current Outpatient Medications on File Prior to Visit  Medication Sig Dispense Refill   atenoloL  (TENORMIN ) 100 MG tablet Take 100 mg by mouth once daily       gemfibroziL  (LOPID ) 600 mg tablet Take 600 mg by mouth 2 (two) times daily before meals       lisinopriL-hydroCHLOROthiazide (ZESTORETIC) 20-25 mg tablet Take 1 tablet by mouth once daily       rosuvastatin  (CRESTOR ) 20 MG tablet Take 20 mg by mouth 2 (two) times daily        No current facility-administered medications on file prior to visit.        Family History  Family History  Family history unknown: Yes        Tobacco Use History  Social History       Tobacco Use  Smoking Status Never  Smokeless Tobacco Never        Social History  Social History        Socioeconomic History   Marital status: Single  Tobacco Use   Smoking status: Never   Smokeless tobacco: Never  Vaping Use   Vaping status: Never  Used  Substance and Sexual Activity   Alcohol use: Never   Drug use: Never   Sexual activity: Defer    Social Drivers of Health        Food Insecurity: No Food Insecurity (02/14/2024)    Received from [facility]    Hunger Vital Sign     Within the past 12 months, you worried that your food would run out before you got the money to buy more.: Never true     Within the past 12 months, the food you bought just didn't last and you didn't have money to get more.: Never true  Transportation Needs: No Transportation Needs (02/14/2024)    Received from [facility] - Transportation     In the past 12 months, has lack of transportation kept you from medical appointments or from getting medications?: No     In the past 12 months, has lack of transportation kept you from meetings,  work, or from getting things needed for daily living?: No        Objective:         Vitals:    03/29/24 1033  BP: 119/79  Pulse: 79  Temp: 36.9 C (98.4 F)  TempSrc: Temporal  SpO2: 96%  Weight: 89.2 kg (196 lb 9.6 oz)  PainSc: 0-No pain      Physical Exam    Constitutional:  WDWN in NAD, conversant, no obvious deformities; lying in bed comfortably Eyes:  Pupils equal, round; sclera anicteric; moist conjunctiva; no lid lag HENT:  Oral mucosa moist; good dentition  Neck:  No masses palpated, trachea midline; no thyromegaly Lungs:  CTA bilaterally; normal respiratory effort CV:  Regular rate and rhythm; no murmurs; extremities well-perfused with no edema Abd:  +bowel sounds, soft, non-tender, no palpable organomegaly; no palpable hernias Musc: Normal gait; no apparent clubbing or cyanosis in extremities Lymphatic:  No palpable cervical or axillary lymphadenopathy Skin:  Warm, dry; no sign of jaundice Psychiatric - alert and oriented x 4; calm mood and affect     Labs, Imaging and Diagnostic Testing: CLINICAL DATA:  Acute nonlocalized abdominal pain   EXAM: CT ABDOMEN AND PELVIS WITH CONTRAST   TECHNIQUE: Multidetector CT imaging of the abdomen and pelvis was performed using the standard protocol following bolus administration of intravenous contrast.   RADIATION DOSE REDUCTION: This exam was performed according to the departmental dose-optimization program which includes automated exposure control, adjustment of the mA and/or kV according to patient size and/or use of iterative reconstruction technique.   CONTRAST:  75mL OMNIPAQUE  IOHEXOL  350 MG/ML SOLN   COMPARISON:  None Available.   FINDINGS: Lower chest: No acute abnormality. Moderate coronary artery calcification   Hepatobiliary: Cholelithiasis without superimposed pericholecystic inflammatory change. Liver unremarkable; no enhancing intrahepatic mass identified. No intra or extrahepatic biliary ductal  dilation.   Pancreas: There is extensive peripancreatic inflammatory stranding and edema, asymmetric surrounding the body, head, and uncinate process of the pancreas with acute inflammatory peripancreatic fluid seen tracking inferiorly to surround the third portion of the duodenum. Findings are in keeping with acute interstitial/edematous pancreatitis. No loculated fluid collections are seen. There is normal enhancement of the pancreatic parenchyma. The pancreatic duct is not dilated.   Spleen: Unremarkable   Adrenals/Urinary Tract: Adrenal glands are unremarkable. Kidneys are normal, without renal calculi, focal lesion, or hydronephrosis. Bladder is unremarkable.   Stomach/Bowel: Mild sigmoid diverticulosis. Circumferential bowel wall thickening and edema involving the second portion of the duodenum related to the  adjacent inflammatory process within pancreas. Stomach, small bowel, and large bowel are otherwise unremarkable. Appendix normal. No evidence of obstruction or focal inflammation. No free intraperitoneal gas or fluid.   Vascular/Lymphatic: No significant vascular findings are present. No enlarged abdominal or pelvic lymph nodes.   Reproductive: Prostate is unremarkable.   Other: No abdominal wall hernia or abnormality. No abdominopelvic ascites.   Musculoskeletal: No acute or significant osseous findings.   IMPRESSION: 1. Acute interstitial/edematous pancreatitis. No evidence of pancreatic necrosis or loculated peripancreatic fluid collection. 2. Cholelithiasis. 3. Mild sigmoid diverticulosis. 4. Moderate coronary artery calcification.     Electronically Signed   By: Dorethia Molt M.D.   On: 02/13/2024 22:13   Assessment and Plan:  Diagnoses and all orders for this visit:   Calculus of gallbladder with chronic cholecystitis without obstruction   Acute biliary pancreatitis without infection or necrosis (HHS-HCC)     Recommend laparoscopic  cholecystectomy with intraoperative cholangiogram.The surgical procedure has been discussed with the patient.  Potential risks, benefits, alternative treatments, and expected outcomes have been explained.  All of the patient's questions at this time have been answered.  The likelihood of reaching the patient's treatment goal is good.  The patient understands the proposed surgical procedure and wishes to proceed.     Amenda Duclos DEWAYNE LIMA, MD  03/29/2024 11:04 AM

## 2024-04-30 NOTE — Patient Instructions (Signed)
 SURGICAL WAITING ROOM VISITATION  Patients having surgery or a procedure may have no more than 2 support people in the waiting area - these visitors may rotate.    Children under the age of 43 must have an adult with them who is not the patient.  Visitors with respiratory illnesses are discouraged from visiting and should remain at home.  If the patient needs to stay at the hospital during part of their recovery, the visitor guidelines for inpatient rooms apply. Pre-op nurse will coordinate an appropriate time for 1 support person to accompany patient in pre-op.  This support person may not rotate.    Please refer to the [facility] website for the visitor guidelines for Inpatients (after your surgery is over and you are in a regular room).       Your procedure is scheduled on:  05/09/2024    Report to [facility] Main Entrance    Report to admitting at  0800 AM   Call this number if you have problems the morning of surgery [phone]   Do not eat food  or drink liquids :After Midnight.               If you have questions, please contact your surgeon's office.      Oral Hygiene is also important to reduce your risk of infection.                                    Remember - BRUSH YOUR TEETH THE MORNING OF SURGERY WITH YOUR REGULAR TOOTHPASTE  DENTURES WILL BE REMOVED PRIOR TO SURGERY PLEASE DO NOT APPLY Poly grip OR ADHESIVES!!!   Do NOT smoke after Midnight   Stop all vitamins and herbal supplements 7 days before surgery.   Take these medicines the morning of surgery with A SIP OF WATER: Atenolol    DO NOT TAKE ANY ORAL DIABETIC MEDICATIONS DAY OF YOUR SURGERY  Bring CPAP mask and tubing day of surgery.                              You may not have any metal on your body including hair pins, jewelry, and body piercing             Do not wear make-up, lotions, powders, perfumes/cologne, or deodorant  Do not wear nail polish including gel and S&S,  artificial/acrylic nails, or any other type of covering on natural nails including finger and toenails. If you have artificial nails, gel coating, etc. that needs to be removed by a nail salon please have this removed prior to surgery or surgery may need to be canceled/ delayed if the surgeon/ anesthesia feels like they are unable to be safely monitored.   Do not shave  48 hours prior to surgery.               Men may shave face and neck.   Do not bring valuables to the hospital. [facility] IS NOT             RESPONSIBLE   FOR VALUABLES.   Contacts, glasses, dentures or bridgework may not be worn into surgery.   Bring small overnight bag day of surgery.   DO NOT BRING YOUR HOME MEDICATIONS TO THE HOSPITAL. PHARMACY WILL DISPENSE MEDICATIONS LISTED ON YOUR MEDICATION LIST TO YOU DURING YOUR ADMISSION IN THE HOSPITAL!  Patients discharged on the day of surgery will not be allowed to drive home.  Someone NEEDS to stay with you for the first 24 hours after anesthesia.   Special Instructions: Bring a copy of your healthcare power of attorney and living will documents the day of surgery if you haven't scanned them before.              Please read over the following fact sheets you were given: IF YOU HAVE QUESTIONS ABOUT YOUR PRE-OP INSTRUCTIONS PLEASE CALL [phone].   If you received a COVID test during your pre-op visit  it is requested that you wear a mask when out in public, stay away from anyone that may not be feeling well and notify your surgeon if you develop symptoms. If you test positive for Covid or have been in contact with anyone that has tested positive in the last 10 days please notify you surgeon.    [facility] - Preparing for Surgery Before surgery, you can play an important role.  Because skin is not sterile, your skin needs to be as free of germs as possible.  You can reduce the number of germs on your skin by washing with CHG (chlorahexidine gluconate) soap before surgery.   CHG is an antiseptic cleaner which kills germs and bonds with the skin to continue killing germs even after washing. Please DO NOT use if you have an allergy to CHG or antibacterial soaps.  If your skin becomes reddened/irritated stop using the CHG and inform your nurse when you arrive at Short Stay. Do not shave (including legs and underarms) for at least 48 hours prior to the first CHG shower.  You may shave your face/neck.  Please follow these instructions carefully:  1.  Shower with CHG Soap the night before surgery ONLY (DO NOT USE THE SOAP THE MORNING OF SURGERY).  2.  If you choose to wash your hair, wash your hair first as usual with your normal  shampoo.  3.  After you shampoo, rinse your hair and body thoroughly to remove the shampoo.                             4.  Use CHG as you would any other liquid soap.  You can apply chg directly to the skin and wash.  Gently with a scrungie or clean washcloth.  5.  Apply the CHG Soap to your body ONLY FROM THE NECK DOWN.   Do   not use on face/ open                           Wound or open sores. Avoid contact with eyes, ears mouth and   genitals (private parts).                       Wash face,  Genitals (private parts) with your normal soap.             6.  Wash thoroughly, paying special attention to the area where your    surgery  will be performed.  7.  Thoroughly rinse your body with warm water from the neck down.  8.  DO NOT shower/wash with your normal soap after using and rinsing off the CHG Soap.                9.  Pat yourself dry with a clean  towel.            10.  Wear clean pajamas.            11.  Place clean sheets on your bed the night of your first shower and do not  sleep with pets. Day of Surgery : Do not apply any CHG, lotions/deodorants the morning of surgery.  Please wear clean clothes to the hospital/surgery center.  FAILURE TO FOLLOW THESE INSTRUCTIONS MAY RESULT IN THE CANCELLATION OF YOUR SURGERY  PATIENT  SIGNATURE_________________________________  NURSE SIGNATURE__________________________________  ________________________________________________________________________

## 2024-04-30 NOTE — Progress Notes (Signed)
 Anesthesia Review:  PCP: Cardiologist :  PPM/ ICD: Device Orders: Rep Notified:  Chest x-ray : EKG : 02/14/24  Echo : Stress test: Cardiac Cath :   Activity level:  Sleep Study/ CPAP : Fasting Blood Sugar :      / Checks Blood Sugar -- times a day:    Blood Thinner/ Instructions /Last Dose: ASA / Instructions/ Last Dose :    Ed- 02/13/24- Acute pancreatitis

## 2024-05-03 ENCOUNTER — Other Ambulatory Visit: Payer: Self-pay

## 2024-05-03 ENCOUNTER — Encounter (HOSPITAL_COMMUNITY)
Admission: RE | Admit: 2024-05-03 | Discharge: 2024-05-03 | Disposition: A | Discharge status: Home / Self Care | Source: Ambulatory Visit | Attending: Surgery | Admitting: Surgery

## 2024-05-03 ENCOUNTER — Encounter (HOSPITAL_COMMUNITY): Payer: Self-pay

## 2024-05-03 VITALS — BP 131/84 | HR 55 | Temp 98.6°F | Resp 16 | Ht 67.0 in | Wt 162.0 lb

## 2024-05-03 DIAGNOSIS — Z01812 Encounter for preprocedural laboratory examination: Secondary | ICD-10-CM | POA: Diagnosis present

## 2024-05-03 DIAGNOSIS — Z01818 Encounter for other preprocedural examination: Secondary | ICD-10-CM

## 2024-05-03 HISTORY — DX: Cerebral palsy, unspecified: G80.9

## 2024-05-03 HISTORY — DX: Headache, unspecified: R51.9

## 2024-05-03 LAB — CBC
HCT: 43.1 % (ref 39.0–52.0)
Hemoglobin: 14.4 g/dL (ref 13.0–17.0)
MCH: 29.3 pg (ref 26.0–34.0)
MCHC: 33.4 g/dL (ref 30.0–36.0)
MCV: 87.6 fL (ref 80.0–100.0)
Platelets: 292 K/uL (ref 150–400)
RBC: 4.92 MIL/uL (ref 4.22–5.81)
RDW: 13.6 % (ref 11.5–15.5)
WBC: 11.5 K/uL — ABNORMAL HIGH (ref 4.0–10.5)
nRBC: 0 % (ref 0.0–0.2)

## 2024-05-03 LAB — BASIC METABOLIC PANEL WITH GFR
Anion gap: 9 (ref 5–15)
BUN: 13 mg/dL (ref 6–20)
CO2: 27 mmol/L (ref 22–32)
Calcium: 9.7 mg/dL (ref 8.9–10.3)
Chloride: 102 mmol/L (ref 98–111)
Creatinine, Ser: 0.83 mg/dL (ref 0.61–1.24)
GFR, Estimated: >60 mL/min (ref >60)
Glucose, Bld: 110 mg/dL — ABNORMAL HIGH (ref 70–99)
Potassium: 4.6 mmol/L (ref 3.5–5.1)
Sodium: 138 mmol/L (ref 135–145)

## 2024-05-08 NOTE — Anesthesia Preprocedure Evaluation (Signed)
 Anesthesia Evaluation  Patient identified by MRN, date of birth, ID band Patient awake    Reviewed: Allergy & Precautions, H&P , NPO status , Patient's Chart, lab work & pertinent test results  Airway Mallampati: II  TM Distance: >3 FB Neck ROM: Full    Dental no notable dental hx. (+) Teeth Intact, Dental Advisory Given   Pulmonary neg pulmonary ROS   Pulmonary exam normal breath sounds clear to auscultation       Cardiovascular Exercise Tolerance: Good hypertension, Pt. on medications and Pt. on home beta blockers negative cardio ROS Normal cardiovascular exam Rhythm:Regular Rate:Normal     Neuro/Psych negative neurological ROS  negative psych ROS   GI/Hepatic negative GI ROS, Neg liver ROS,,,  Endo/Other  negative endocrine ROS    Renal/GU negative Renal ROS  negative genitourinary   Musculoskeletal negative musculoskeletal ROS (+)    Abdominal   Peds negative pediatric ROS (+)  Hematology negative hematology ROS (+)   Anesthesia Other Findings   Reproductive/Obstetrics negative OB ROS                              Anesthesia Physical Anesthesia Plan  ASA: 2  Anesthesia Plan: General   Post-op Pain Management: Ofirmev  IV (intra-op)*, Precedex and Minimal or no pain anticipated   Induction: Intravenous  PONV Risk Score and Plan: 2 and Ondansetron , Dexamethasone and Treatment may vary due to age or medical condition  Airway Management Planned: Oral ETT  Additional Equipment: None  Intra-op Plan:   Post-operative Plan: Extubation in OR  Informed Consent: I have reviewed the patients History and Physical, chart, labs and discussed the procedure including the risks, benefits and alternatives for the proposed anesthesia with the patient or authorized representative who has indicated his/her understanding and acceptance.       Plan Discussed with:  Anesthesiologist  Anesthesia Plan Comments: (  )         Anesthesia Quick Evaluation

## 2024-05-09 ENCOUNTER — Encounter (HOSPITAL_COMMUNITY)
Admission: RE | Disposition: A | Discharge status: Home / Self Care | Payer: Self-pay | Source: Home / Self Care | Attending: Surgery

## 2024-05-09 ENCOUNTER — Ambulatory Visit (HOSPITAL_COMMUNITY)

## 2024-05-09 ENCOUNTER — Ambulatory Visit (HOSPITAL_COMMUNITY)
Admission: RE | Admit: 2024-05-09 | Discharge: 2024-05-09 | Disposition: A | Discharge status: Home / Self Care | Attending: Surgery | Admitting: Surgery

## 2024-05-09 ENCOUNTER — Ambulatory Visit (HOSPITAL_COMMUNITY): Payer: Self-pay | Admitting: Anesthesiology

## 2024-05-09 ENCOUNTER — Ambulatory Visit (HOSPITAL_COMMUNITY): Payer: Self-pay | Admitting: Physician Assistant

## 2024-05-09 ENCOUNTER — Encounter (HOSPITAL_COMMUNITY): Payer: Self-pay | Admitting: Surgery

## 2024-05-09 ENCOUNTER — Other Ambulatory Visit (HOSPITAL_COMMUNITY): Payer: Self-pay

## 2024-05-09 DIAGNOSIS — Z01818 Encounter for other preprocedural examination: Secondary | ICD-10-CM

## 2024-05-09 DIAGNOSIS — K811 Chronic cholecystitis: Secondary | ICD-10-CM | POA: Insufficient documentation

## 2024-05-09 DIAGNOSIS — I10 Essential (primary) hypertension: Secondary | ICD-10-CM | POA: Insufficient documentation

## 2024-05-09 DIAGNOSIS — K8010 Calculus of gallbladder with chronic cholecystitis without obstruction: Secondary | ICD-10-CM

## 2024-05-09 DIAGNOSIS — G809 Cerebral palsy, unspecified: Secondary | ICD-10-CM | POA: Diagnosis not present

## 2024-05-09 HISTORY — PX: CHOLECYSTECTOMY: SHX55

## 2024-05-09 SURGERY — LAPAROSCOPIC CHOLECYSTECTOMY WITH INTRAOPERATIVE CHOLANGIOGRAM
Anesthesia: General | Site: Abdomen

## 2024-05-09 MED ORDER — SUGAMMADEX SODIUM 200 MG/2ML IV SOLN
INTRAVENOUS | Status: AC
  Filled 2024-05-09: qty 2

## 2024-05-09 MED ORDER — ONDANSETRON HCL 4 MG/2ML IJ SOLN
INTRAMUSCULAR | Status: DC | PRN
  Administered 2024-05-09: 4 mg via INTRAVENOUS

## 2024-05-09 MED ORDER — ORAL CARE MOUTH RINSE
15.0000 mL | Freq: Once | OROMUCOSAL | Status: AC
Start: 2024-05-09 — End: 2024-05-09

## 2024-05-09 MED ORDER — IOHEXOL 300 MG/ML  SOLN
INTRAMUSCULAR | Status: DC | PRN
  Administered 2024-05-09: 10 mL

## 2024-05-09 MED ORDER — ROCURONIUM BROMIDE 100 MG/10ML IV SOLN
INTRAVENOUS | Status: DC | PRN
  Administered 2024-05-09: 50 mg via INTRAVENOUS

## 2024-05-09 MED ORDER — ONDANSETRON 4 MG PO TBDP
4.0000 mg | ORAL_TABLET | Freq: Four times a day (QID) | ORAL | 0 refills | Status: AC | PRN
  Filled 2024-05-09: qty 20, 5d supply, fill #0

## 2024-05-09 MED ORDER — CHLORHEXIDINE GLUCONATE CLOTH 2 % EX PADS: 6.0000 | MEDICATED_PAD | Freq: Once | CUTANEOUS | Status: DC

## 2024-05-09 MED ORDER — KETOROLAC TROMETHAMINE 30 MG/ML IJ SOLN
INTRAMUSCULAR | Status: AC
  Filled 2024-05-09: qty 1

## 2024-05-09 MED ORDER — DEXAMETHASONE SOD PHOSPHATE PF 10 MG/ML IJ SOLN
INTRAMUSCULAR | Status: DC | PRN
  Administered 2024-05-09: 10 mg via INTRAVENOUS

## 2024-05-09 MED ORDER — LACTATED RINGERS IV SOLN
INTRAVENOUS | Status: DC | PRN
Start: 2024-05-09 — End: 2024-05-09

## 2024-05-09 MED ORDER — STERILE WATER FOR IRRIGATION IR SOLN
Status: DC | PRN
  Administered 2024-05-09: 1000 mL

## 2024-05-09 MED ORDER — OXYCODONE HCL 5 MG/5ML PO SOLN: 5.0000 mg | Freq: Once | ORAL | Status: DC | PRN

## 2024-05-09 MED ORDER — BUPIVACAINE-EPINEPHRINE 0.25% -1:200000 IJ SOLN
INTRAMUSCULAR | Status: DC | PRN
  Administered 2024-05-09: 10 mL

## 2024-05-09 MED ORDER — MIDAZOLAM HCL 5 MG/5ML IJ SOLN
INTRAMUSCULAR | Status: DC | PRN
  Administered 2024-05-09: 2 mg via INTRAVENOUS

## 2024-05-09 MED ORDER — PROPOFOL 10 MG/ML IV BOLUS
INTRAVENOUS | Status: AC
  Filled 2024-05-09: qty 20

## 2024-05-09 MED ORDER — LACTATED RINGERS IV SOLN: INTRAVENOUS | Status: DC

## 2024-05-09 MED ORDER — FENTANYL CITRATE (PF) 50 MCG/ML IJ SOSY
PREFILLED_SYRINGE | INTRAMUSCULAR | Status: AC
  Filled 2024-05-09: qty 2

## 2024-05-09 MED ORDER — ROCURONIUM BROMIDE 10 MG/ML (PF) SYRINGE
PREFILLED_SYRINGE | INTRAVENOUS | Status: AC
  Filled 2024-05-09: qty 10

## 2024-05-09 MED ORDER — LIDOCAINE HCL (PF) 2 % IJ SOLN
INTRAMUSCULAR | Status: AC
  Filled 2024-05-09: qty 5

## 2024-05-09 MED ORDER — PROPOFOL 10 MG/ML IV BOLUS
INTRAVENOUS | Status: DC | PRN
  Administered 2024-05-09: 180 mg via INTRAVENOUS

## 2024-05-09 MED ORDER — ONDANSETRON HCL 4 MG/2ML IJ SOLN
4.0000 mg | Freq: Once | INTRAMUSCULAR | Status: DC | PRN
Start: 2024-05-09 — End: 2024-05-09

## 2024-05-09 MED ORDER — 0.9 % SODIUM CHLORIDE (POUR BTL) OPTIME
TOPICAL | Status: DC | PRN
  Administered 2024-05-09: 1000 mL

## 2024-05-09 MED ORDER — SUGAMMADEX SODIUM 200 MG/2ML IV SOLN
INTRAVENOUS | Status: DC | PRN
  Administered 2024-05-09: 200 mg via INTRAVENOUS

## 2024-05-09 MED ORDER — LACTATED RINGERS IR SOLN
Status: DC | PRN
  Administered 2024-05-09: 1000 mL

## 2024-05-09 MED ORDER — ONDANSETRON HCL 4 MG/2ML IJ SOLN
INTRAMUSCULAR | Status: AC
Start: 2024-05-09 — End: 2024-05-09
  Filled 2024-05-09: qty 2

## 2024-05-09 MED ORDER — CEFAZOLIN SODIUM-DEXTROSE 2-4 GM/100ML-% IV SOLN
2.0000 g | INTRAVENOUS | Status: AC
  Administered 2024-05-09: 2 g via INTRAVENOUS
  Filled 2024-05-09: qty 100

## 2024-05-09 MED ORDER — OXYCODONE HCL 5 MG PO TABS
5.0000 mg | ORAL_TABLET | Freq: Four times a day (QID) | ORAL | 0 refills | Status: AC | PRN
  Filled 2024-05-09: qty 20, 5d supply, fill #0

## 2024-05-09 MED ORDER — BUPIVACAINE-EPINEPHRINE (PF) 0.25% -1:200000 IJ SOLN
INTRAMUSCULAR | Status: AC
  Filled 2024-05-09: qty 30

## 2024-05-09 MED ORDER — CHLORHEXIDINE GLUCONATE 0.12 % MT SOLN
15.0000 mL | Freq: Once | OROMUCOSAL | Status: AC
  Administered 2024-05-09: 15 mL via OROMUCOSAL

## 2024-05-09 MED ORDER — OXYCODONE HCL 5 MG PO TABS: 5.0000 mg | ORAL_TABLET | Freq: Once | ORAL | Status: DC | PRN

## 2024-05-09 MED ORDER — KETOROLAC TROMETHAMINE 30 MG/ML IJ SOLN
INTRAMUSCULAR | Status: DC | PRN
  Administered 2024-05-09: 30 mg via INTRAVENOUS

## 2024-05-09 MED ORDER — PHENYLEPHRINE 80 MCG/ML (10ML) SYRINGE FOR IV PUSH (FOR BLOOD PRESSURE SUPPORT)
PREFILLED_SYRINGE | INTRAVENOUS | Status: DC | PRN
  Administered 2024-05-09 (×2): 160 ug via INTRAVENOUS

## 2024-05-09 MED ORDER — LIDOCAINE HCL (CARDIAC) PF 100 MG/5ML IV SOSY
PREFILLED_SYRINGE | INTRAVENOUS | Status: DC | PRN
  Administered 2024-05-09: 100 mg via INTRAVENOUS

## 2024-05-09 MED ORDER — MIDAZOLAM HCL 2 MG/2ML IJ SOLN
INTRAMUSCULAR | Status: AC
  Filled 2024-05-09: qty 2

## 2024-05-09 MED ORDER — FENTANYL CITRATE (PF) 100 MCG/2ML IJ SOLN
INTRAMUSCULAR | Status: DC | PRN
  Administered 2024-05-09 (×2): 50 ug via INTRAVENOUS

## 2024-05-09 MED ORDER — FENTANYL CITRATE (PF) 50 MCG/ML IJ SOSY
25.0000 ug | PREFILLED_SYRINGE | INTRAMUSCULAR | Status: DC | PRN
  Administered 2024-05-09: 25 ug via INTRAVENOUS
  Administered 2024-05-09: 50 ug via INTRAVENOUS

## 2024-05-09 MED ORDER — ACETAMINOPHEN 500 MG PO TABS
1000.0000 mg | ORAL_TABLET | ORAL | Status: AC
  Administered 2024-05-09: 1000 mg via ORAL
  Filled 2024-05-09: qty 2

## 2024-05-09 MED ORDER — MEPERIDINE HCL 25 MG/ML IJ SOLN: 6.2500 mg | INTRAMUSCULAR | Status: DC | PRN

## 2024-05-09 MED ORDER — FENTANYL CITRATE (PF) 100 MCG/2ML IJ SOLN
INTRAMUSCULAR | Status: AC
  Filled 2024-05-09: qty 2

## 2024-05-09 SURGICAL SUPPLY — 33 items
BENZOIN TINCTURE PRP APPL 2/3 (GAUZE/BANDAGES/DRESSINGS) ×1 IMPLANT
CHLORAPREP W/TINT 26 (MISCELLANEOUS) ×1 IMPLANT
CLIP APPLIE ROT 10 11.4 M/L (STAPLE) ×1 IMPLANT
COVER MAYO STAND XLG (MISCELLANEOUS) ×1 IMPLANT
COVER SURGICAL LIGHT HANDLE (MISCELLANEOUS) ×1 IMPLANT
DRAPE C-ARM 42X120 X-RAY (DRAPES) ×1 IMPLANT
DRAPE LAPAROSCOPIC ABDOMINAL (DRAPES) ×1 IMPLANT
DRSG TEGADERM 2-3/8X2-3/4 SM (GAUZE/BANDAGES/DRESSINGS) ×3 IMPLANT
DRSG TEGADERM 4X4.75 (GAUZE/BANDAGES/DRESSINGS) ×1 IMPLANT
ELECT REM PT RETURN 15FT ADLT (MISCELLANEOUS) ×1 IMPLANT
GAUZE SPONGE 2X2 8PLY STRL LF (GAUZE/BANDAGES/DRESSINGS) IMPLANT
GLOVE BIO SURGEON STRL SZ7 (GLOVE) ×1 IMPLANT
GLOVE BIOGEL PI IND STRL 7.5 (GLOVE) ×1 IMPLANT
GOWN STRL REUS W/ TWL LRG LVL3 (GOWN DISPOSABLE) ×1 IMPLANT
IRRIGATION SUCT STRKRFLW 2 WTP (MISCELLANEOUS) ×1 IMPLANT
KIT BASIN OR (CUSTOM PROCEDURE TRAY) ×1 IMPLANT
KIT TURNOVER KIT A (KITS) ×1 IMPLANT
NS IRRIG 1000ML POUR BTL (IV SOLUTION) ×1 IMPLANT
POUCH RETRIEVAL ECOSAC 10 (ENDOMECHANICALS) IMPLANT
SCISSORS LAP 5X35 DISP (ENDOMECHANICALS) ×1 IMPLANT
SET CHOLANGIOGRAPH MIX (MISCELLANEOUS) ×1 IMPLANT
SET TUBE SMOKE EVAC HIGH FLOW (TUBING) ×1 IMPLANT
SLEEVE Z-THREAD 5X100MM (TROCAR) ×1 IMPLANT
SPIKE FLUID TRANSFER (MISCELLANEOUS) ×1 IMPLANT
STRIP CLOSURE SKIN 1/2X4 (GAUZE/BANDAGES/DRESSINGS) ×1 IMPLANT
SUT MNCRL AB 4-0 PS2 18 (SUTURE) ×1 IMPLANT
SUT VICRYL 0 UR6 27IN ABS (SUTURE) ×1 IMPLANT
SYSTEM BAG RETRIEVAL 10MM (BASKET) IMPLANT
TOWEL OR 17X26 10 PK STRL BLUE (TOWEL DISPOSABLE) ×1 IMPLANT
TRAY LAPAROSCOPIC (CUSTOM PROCEDURE TRAY) ×1 IMPLANT
TROCAR 11X100 Z THREAD (TROCAR) ×1 IMPLANT
TROCAR BALLN 12MMX100 BLUNT (TROCAR) ×1 IMPLANT
TROCAR Z-THREAD OPTICAL 5X100M (TROCAR) ×1 IMPLANT

## 2024-05-09 NOTE — Discharge Instructions (Signed)
 [facility] ______[facility] LAPAROSCOPIC SURGERY: POST OP INSTRUCTIONS Always review your discharge instruction sheet given to you by the facility where your surgery was performed. IF YOU HAVE DISABILITY OR FAMILY LEAVE FORMS, YOU MUST BRING THEM TO THE OFFICE FOR PROCESSING.   DO NOT GIVE THEM TO YOUR DOCTOR.  A prescription for pain medication may be given to you upon discharge.  Take your pain medication as prescribed, if needed.  If narcotic pain medicine is not needed, then you may take acetaminophen  (Tylenol ) or ibuprofen  (Advil ) as needed. Take your usually prescribed medications unless otherwise directed. If you need a refill on your pain medication, please contact your pharmacy.  They will contact our office to request authorization. Prescriptions will not be filled after 5pm or on week-ends. You should follow a light diet the first few days after arrival home, such as soup and crackers, etc.  Be sure to include lots of fluids daily. Most patients will experience some swelling and bruising in the area of the incisions.  Ice packs will help.  Swelling and bruising can take several days to resolve.  It is common to experience some constipation if taking pain medication after surgery.  Increasing fluid intake and taking a stool softener (such as Colace) will usually help or prevent this problem from occurring.  A mild laxative (Milk of Magnesia or Miralax) should be taken according to package instructions if there are no bowel movements after 48 hours. Unless discharge instructions indicate otherwise, you may remove your bandages 24-48 hours after surgery, and you may shower at that time.  You may have steri-strips (small skin tapes) in place directly over the incision.  These strips should be left on the skin for 7-10 days.  If your surgeon used skin glue on the incision, you may shower in 24 hours.  The glue will flake off over the next 2-3 weeks.  Any sutures or staples will be  removed at the office during your follow-up visit. ACTIVITIES:  You may resume regular (light) daily activities beginning the next day--such as daily self-care, walking, climbing stairs--gradually increasing activities as tolerated.  You may have sexual intercourse when it is comfortable.  Refrain from any heavy lifting or straining until approved by your doctor. You may drive when you are no longer taking prescription pain medication, you can comfortably wear a seatbelt, and you can safely maneuver your car and apply brakes. RETURN TO WORK:  __________________________________________________________ Richard Carpenter should see your doctor in the office for a follow-up appointment approximately 2-3 weeks after your surgery.  Make sure that you call for this appointment within a day or two after you arrive home to insure a convenient appointment time. OTHER INSTRUCTIONS: __________________________________________________________________________________________________________________________ __________________________________________________________________________________________________________________________ WHEN TO CALL YOUR DOCTOR: Fever over 101.0 Inability to urinate Continued bleeding from incision. Increased pain, redness, or drainage from the incision. Increasing abdominal pain  The clinic staff is available to answer your questions during regular business hours.  Please don't hesitate to call and ask to speak to one of the nurses for clinical concerns.  If you have a medical emergency, go to the nearest emergency room or call 911.  A surgeon from [facility] is always on call at the hospital. [address], [address], [place], [place]  [address] ? [address] [address], [place], [place]   [address] [phone] ? [phone] ? FAX [phone] Web site: [email or web address]

## 2024-05-09 NOTE — H&P (Signed)
 Subjective    Chief Complaint: New Consultation (cholelithiasis)       History of Present Illness: Richard Carpenter is a 53 y.o. male who is seen today as an office consultation at the request of Dr. Loring for evaluation of New Consultation (cholelithiasis) .     This is a 53 year old male with cerebral palsy who resides in a group home who presents after recent hospitalization.  He is accompanied today by the manager of the group home who is his legal caregiver.   The patient was admitted to the hospital on 02/14/2024 with 2 days of acute epigastric pain associated with nausea, vomiting, diarrhea.  He was diagnosed with mild pancreatitis.  CT scan revealed cholelithiasis but no sign of acute cholecystitis.  He did have edema around his pancreas.  His total bilirubin was mildly elevated 1.3 but quickly normalized.  He is now doing well with no recurrent symptoms.  He has been referred to us  to discuss elective cholecystectomy to prevent a recurrence of his symptoms and pancreatitis.  He reports that his bowel movements have returned to normal.     Review of Systems: A complete review of systems was obtained from the patient.  I have reviewed this information and discussed as appropriate with the patient.  See HPI as well for other ROS.   Review of Systems  Constitutional: Negative.   HENT: Negative.    Eyes: Negative.   Respiratory: Negative.    Cardiovascular: Negative.   Gastrointestinal:  Positive for abdominal pain, blood in stool, diarrhea, nausea and vomiting.  Genitourinary: Negative.   Musculoskeletal: Negative.   Skin: Negative.   Neurological: Negative.   Endo/Heme/Allergies: Negative.   Psychiatric/Behavioral: Negative.          Medical History: Past Medical History         Past Medical History:  Diagnosis Date   Hyperlipidemia     Hypertension          Problem List       Patient Active Problem List  Diagnosis   Acute pancreatitis (HHS-HCC)   Elevated serum  creatinine   Sensorineural hearing loss, bilateral        Past Surgical History  History reviewed. No pertinent surgical history.      Allergies  No Known Allergies      Medications Ordered Prior to Encounter             Current Outpatient Medications on File Prior to Visit  Medication Sig Dispense Refill   atenoloL  (TENORMIN ) 100 MG tablet Take 100 mg by mouth once daily       gemfibroziL  (LOPID ) 600 mg tablet Take 600 mg by mouth 2 (two) times daily before meals       lisinopriL-hydroCHLOROthiazide (ZESTORETIC) 20-25 mg tablet Take 1 tablet by mouth once daily       rosuvastatin  (CRESTOR ) 20 MG tablet Take 20 mg by mouth 2 (two) times daily        No current facility-administered medications on file prior to visit.        Family History  Family History  Family history unknown: Yes        Tobacco Use History  Social History         Tobacco Use  Smoking Status Never  Smokeless Tobacco Never        Social History  Social History           Socioeconomic History   Marital status: Single  Tobacco Use  Smoking status: Never   Smokeless tobacco: Never  Vaping Use   Vaping status: Never Used  Substance and Sexual Activity   Alcohol use: Never   Drug use: Never   Sexual activity: Defer    Social Drivers of Health           Food Insecurity: No Food Insecurity (02/14/2024)    Received from [facility]    Hunger Vital Sign     Within the past 12 months, you worried that your food would run out before you got the money to buy more.: Never true     Within the past 12 months, the food you bought just didn't last and you didn't have money to get more.: Never true  Transportation Needs: No Transportation Needs (02/14/2024)    Received from [facility]     In the past 12 months, has lack of transportation kept you from medical appointments or from getting medications?: No     In the past 12 months, has lack of transportation kept you  from meetings, work, or from getting things needed for daily living?: No        Objective:           Vitals:    03/29/24 1033  BP: 119/79  Pulse: 79  Temp: 36.9 C (98.4 F)  TempSrc: Temporal  SpO2: 96%  Weight: 89.2 kg (196 lb 9.6 oz)  PainSc: 0-No pain      Physical Exam    Constitutional:  WDWN in NAD, conversant, no obvious deformities; lying in bed comfortably Eyes:  Pupils equal, round; sclera anicteric; moist conjunctiva; no lid lag HENT:  Oral mucosa moist; good dentition  Neck:  No masses palpated, trachea midline; no thyromegaly Lungs:  CTA bilaterally; normal respiratory effort CV:  Regular rate and rhythm; no murmurs; extremities well-perfused with no edema Abd:  +bowel sounds, soft, non-tender, no palpable organomegaly; no palpable hernias Musc: Normal gait; no apparent clubbing or cyanosis in extremities Lymphatic:  No palpable cervical or axillary lymphadenopathy Skin:  Warm, dry; no sign of jaundice Psychiatric - alert and oriented x 4; calm mood and affect     Labs, Imaging and Diagnostic Testing: CLINICAL DATA:  Acute nonlocalized abdominal pain   EXAM: CT ABDOMEN AND PELVIS WITH CONTRAST   TECHNIQUE: Multidetector CT imaging of the abdomen and pelvis was performed using the standard protocol following bolus administration of intravenous contrast.   RADIATION DOSE REDUCTION: This exam was performed according to the departmental dose-optimization program which includes automated exposure control, adjustment of the mA and/or kV according to patient size and/or use of iterative reconstruction technique.   CONTRAST:  75mL OMNIPAQUE  IOHEXOL  350 MG/ML SOLN   COMPARISON:  None Available.   FINDINGS: Lower chest: No acute abnormality. Moderate coronary artery calcification   Hepatobiliary: Cholelithiasis without superimposed pericholecystic inflammatory change. Liver unremarkable; no enhancing intrahepatic mass identified. No intra or  extrahepatic biliary ductal dilation.   Pancreas: There is extensive peripancreatic inflammatory stranding and edema, asymmetric surrounding the body, head, and uncinate process of the pancreas with acute inflammatory peripancreatic fluid seen tracking inferiorly to surround the third portion of the duodenum. Findings are in keeping with acute interstitial/edematous pancreatitis. No loculated fluid collections are seen. There is normal enhancement of the pancreatic parenchyma. The pancreatic duct is not dilated.   Spleen: Unremarkable   Adrenals/Urinary Tract: Adrenal glands are unremarkable. Kidneys are normal, without renal calculi, focal lesion, or hydronephrosis. Bladder is unremarkable.  Stomach/Bowel: Mild sigmoid diverticulosis. Circumferential bowel wall thickening and edema involving the second portion of the duodenum related to the adjacent inflammatory process within pancreas. Stomach, small bowel, and large bowel are otherwise unremarkable. Appendix normal. No evidence of obstruction or focal inflammation. No free intraperitoneal gas or fluid.   Vascular/Lymphatic: No significant vascular findings are present. No enlarged abdominal or pelvic lymph nodes.   Reproductive: Prostate is unremarkable.   Other: No abdominal wall hernia or abnormality. No abdominopelvic ascites.   Musculoskeletal: No acute or significant osseous findings.   IMPRESSION: 1. Acute interstitial/edematous pancreatitis. No evidence of pancreatic necrosis or loculated peripancreatic fluid collection. 2. Cholelithiasis. 3. Mild sigmoid diverticulosis. 4. Moderate coronary artery calcification.     Electronically Signed   By: Dorethia Molt M.D.   On: 02/13/2024 22:13   Assessment and Plan:  Diagnoses and all orders for this visit:   Calculus of gallbladder with chronic cholecystitis without obstruction   Acute biliary pancreatitis without infection or necrosis (HHS-HCC)      Recommend laparoscopic cholecystectomy with intraoperative cholangiogram.The surgical procedure has been discussed with the patient.  Potential risks, benefits, alternative treatments, and expected outcomes have been explained.  All of the patient's questions at this time have been answered.  The likelihood of reaching the patient's treatment goal is good.  The patient understands the proposed surgical procedure and wishes to proceed.     Donnice POUR. Belinda, MD, [facility]  General Surgery   05/09/2024 8:56 AM

## 2024-05-09 NOTE — Anesthesia Postprocedure Evaluation (Signed)
 Anesthesia Post Note  Patient: Richard Carpenter  Procedure(s) Performed: LAPAROSCOPIC CHOLECYSTECTOMY WITH INTRAOPERATIVE CHOLANGIOGRAM (Abdomen)     Patient location during evaluation: PACU Anesthesia Type: General Level of consciousness: awake and alert Pain management: pain level controlled Vital Signs Assessment: post-procedure vital signs reviewed and stable Respiratory status: spontaneous breathing, nonlabored ventilation, respiratory function stable and patient connected to nasal cannula oxygen Cardiovascular status: blood pressure returned to baseline and stable Postop Assessment: no apparent nausea or vomiting Anesthetic complications: no   No notable events documented.  Last Vitals:  Vitals:   05/09/24 1153 05/09/24 1220  BP:  113/67  Pulse:  (!) 52  Resp:  16  Temp:  (!) 36.4 C  SpO2: 100% 98%    Last Pain:  Vitals:   05/09/24 1245  TempSrc:   PainSc: 0-No pain                 Talia Hoheisel

## 2024-05-09 NOTE — Op Note (Signed)
 Laparoscopic Cholecystectomy with IOC Procedure Note  Indications: This is a 53 year old male with cerebral palsy who resides in a group home who presents after recent hospitalization.  He is accompanied today by the manager of the group home who is his legal caregiver.   The patient was admitted to the hospital on 02/14/2024 with 2 days of acute epigastric pain associated with nausea, vomiting, diarrhea.  He was diagnosed with mild pancreatitis.  CT scan revealed cholelithiasis but no sign of acute cholecystitis.  He did have edema around his pancreas.  His total bilirubin was mildly elevated 1.3 but quickly normalized.  He is now doing well with no recurrent symptoms.  He has been referred to us  to discuss elective cholecystectomy to prevent a recurrence of his symptoms and pancreatitis.  He reports that his bowel movements have returned to normal.  Pre-operative Diagnosis: Calculus of gallbladder with other cholecystitis, without mention of obstruction  Post-operative Diagnosis: Same  Surgeon: Donnice MARLA Lima   Assistants: none  Anesthesia: General endotracheal anesthesia  ASA Class: 2  Procedure Details  The patient was seen again in the Holding Room. The risks, benefits, complications, treatment options, and expected outcomes were discussed with the patient. The possibilities of reaction to medication, pulmonary aspiration, perforation of viscus, bleeding, recurrent infection, finding a normal gallbladder, the need for additional procedures, failure to diagnose a condition, the possible need to convert to an open procedure, and creating a complication requiring transfusion or operation were discussed with the patient. The likelihood of improving the patient's symptoms with return to their baseline status is good.  The patient and/or family concurred with the proposed plan, giving informed consent. The site of surgery properly noted. The patient was taken to Operating Room, identified as  Richard Carpenter and the procedure verified as Laparoscopic Cholecystectomy with Intraoperative Cholangiogram. A Time Out was held and the above information confirmed.  Prior to the induction of general anesthesia, antibiotic prophylaxis was administered. General endotracheal anesthesia was then administered and tolerated well. After the induction, the abdomen was prepped with Chloraprep and draped in the sterile fashion. The patient was positioned in the supine position.  Local anesthetic agent was injected into the skin above the umbilicus and an incision made. We dissected down to the abdominal fascia with blunt dissection.  The fascia was incised vertically and we entered the peritoneal cavity bluntly.  A pursestring suture of 0-Vicryl was placed around the fascial opening.  The Hasson cannula was inserted and secured with the stay suture.  Pneumoperitoneum was then created with CO2 and tolerated well without any adverse changes in the patient's vital signs. An 11-mm port was placed in the subxiphoid position.  Two 5-mm ports were placed in the right upper quadrant. All skin incisions were infiltrated with a local anesthetic agent before making the incision and placing the trocars.   We positioned the patient in reverse Trendelenburg, tilted slightly to the patient's left.  The gallbladder was identified, the fundus grasped and retracted cephalad.  There are some flimsy adhesions to the fundus of the gallbladder.  Adhesions were lysed bluntly and with the electrocautery where indicated, taking care not to injure any adjacent organs or viscus. The infundibulum was grasped and retracted laterally, exposing the peritoneum overlying the triangle of Calot. This was then divided and exposed in a blunt fashion. A critical view of the cystic duct and cystic artery was obtained.  The cystic duct was clearly identified and bluntly dissected circumferentially. The cystic duct was ligated  with a clip distally.   An  incision was made in the cystic duct and the [facility] cholangiogram catheter introduced. The catheter was secured using a clip. A cholangiogram was then obtained which showed good visualization of the distal and proximal biliary tree.  The patient has a very long cystic duct.  Initially, there were some filling defects in the common bile duct.  However upon continued flushing with contrast, okay so that defect passed into the duodenum.  Contrast flowed easily into the duodenum. The catheter was then removed.   The cystic duct was then ligated with clips and divided. The cystic artery was identified, dissected free, ligated with clips and divided as well.   The gallbladder was dissected from the liver bed in retrograde fashion with the electrocautery. The gallbladder was removed and placed in an Eco sac. The liver bed was irrigated and inspected. Hemostasis was achieved with the electrocautery. Copious irrigation was utilized and was repeatedly aspirated until clear.  The gallbladder and Eco sac were then removed through the umbilical port site.  The pursestring suture was used to close the umbilical fascia.    We again inspected the right upper quadrant for hemostasis.  Pneumoperitoneum was released as we removed the trocars.  4-0 Monocryl was used to close the skin.   Benzoin, steri-strips, and clean dressings were applied. The patient was then extubated and brought to the recovery room in stable condition. Instrument, sponge, and needle counts were correct at closure and at the conclusion of the case.   Findings: Cholecystitis with Cholelithiasis  Estimated Blood Loss: Minimal         Drains: none         Specimens: Gallbladder           Complications: None; patient tolerated the procedure well.         Disposition: PACU - hemodynamically stable.         Condition: stable  Donnice POUR. Belinda, MD, [facility] Surgery  General Surgery   05/09/2024 11:03 AM

## 2024-05-09 NOTE — Transfer of Care (Signed)
 Immediate Anesthesia Transfer of Care Note  Patient: Richard Carpenter  Procedure(s) Performed: LAPAROSCOPIC CHOLECYSTECTOMY WITH INTRAOPERATIVE CHOLANGIOGRAM (Abdomen)  Patient Location: PACU  Anesthesia Type:General  Level of Consciousness: awake and alert   Airway & Oxygen Therapy: Patient Spontanous Breathing and Patient connected to face mask oxygen  Post-op Assessment: Report given to RN and Post -op Vital signs reviewed and stable  Post vital signs: Reviewed and stable  Last Vitals:  Vitals Value Taken Time  BP 145/92 05/09/24 11:10  Temp 36.5 C 05/09/24 11:10  Pulse 69 05/09/24 11:11  Resp 15 05/09/24 11:11  SpO2 100 % 05/09/24 11:11  Vitals shown include unfiled device data.  Last Pain:  Vitals:   05/09/24 0858  TempSrc:   PainSc: 0-No pain      Patients Stated Pain Goal: 4 (05/09/24 0858)  Complications: No notable events documented.

## 2024-05-09 NOTE — Anesthesia Procedure Notes (Signed)
 Procedure Name: Intubation Date/Time: 05/09/2024 9:59 AM  Performed by: Deeann Eva BROCKS, CRNAPre-anesthesia Checklist: Patient identified, Emergency Drugs available, Suction available and Patient being monitored Patient Re-evaluated:Patient Re-evaluated prior to induction Oxygen Delivery Method: Circle System Utilized Preoxygenation: Pre-oxygenation with 100% oxygen Induction Type: IV induction Ventilation: Mask ventilation without difficulty Laryngoscope Size: Mac and 3 Grade View: Grade I Tube type: Oral Tube size: 8.0 mm Number of attempts: 1 Airway Equipment and Method: Stylet and Oral airway Placement Confirmation: ETT inserted through vocal cords under direct vision, positive ETCO2 and breath sounds checked- equal and bilateral Secured at: 22 cm Tube secured with: Tape Dental Injury: Teeth and Oropharynx as per pre-operative assessment

## 2024-05-10 ENCOUNTER — Encounter (HOSPITAL_COMMUNITY): Payer: Self-pay | Admitting: Surgery

## 2024-05-13 LAB — SURGICAL PATHOLOGY
